# Patient Record
Sex: Female | Born: 1980 | Race: Black or African American | Hispanic: No | Smoking: Never smoker
Health system: Southern US, Community
[De-identification: ages and names within clinical notes are randomized; demographics above are authoritative.]

## PROBLEM LIST (undated history)

## (undated) DIAGNOSIS — E119 Type 2 diabetes mellitus without complications: Secondary | ICD-10-CM

## (undated) DIAGNOSIS — I10 Essential (primary) hypertension: Secondary | ICD-10-CM

## (undated) DIAGNOSIS — D75839 Thrombocytosis, unspecified: Secondary | ICD-10-CM

## (undated) HISTORY — DX: Type 2 diabetes mellitus without complications: E11.9

## (undated) HISTORY — DX: Thrombocytosis, unspecified: D75.839

## (undated) HISTORY — DX: Essential (primary) hypertension: I10

## (undated) HISTORY — PX: CHOLECYSTECTOMY: SHX55

---

## 2020-05-11 LAB — OB RESULTS CONSOLE RUBELLA ANTIBODY, IGM: Rubella: IMMUNE

## 2020-05-11 LAB — OB RESULTS CONSOLE HEPATITIS B SURFACE ANTIGEN: Hepatitis B Surface Ag: NEGATIVE

## 2020-05-11 LAB — OB RESULTS CONSOLE HIV ANTIBODY (ROUTINE TESTING): HIV: NONREACTIVE

## 2020-05-12 LAB — OB RESULTS CONSOLE GC/CHLAMYDIA
Chlamydia: NEGATIVE
Gonorrhea: NEGATIVE

## 2020-05-18 DIAGNOSIS — B977 Papillomavirus as the cause of diseases classified elsewhere: Secondary | ICD-10-CM | POA: Diagnosis not present

## 2020-06-29 ENCOUNTER — Other Ambulatory Visit: Payer: Self-pay | Admitting: Obstetrics and Gynecology

## 2020-06-29 DIAGNOSIS — O30009 Twin pregnancy, unspecified number of placenta and unspecified number of amniotic sacs, unspecified trimester: Secondary | ICD-10-CM

## 2020-07-10 ENCOUNTER — Other Ambulatory Visit: Payer: Self-pay

## 2020-07-10 ENCOUNTER — Other Ambulatory Visit: Payer: Self-pay | Admitting: *Deleted

## 2020-07-10 ENCOUNTER — Ambulatory Visit: Payer: Self-pay | Attending: Obstetrics and Gynecology

## 2020-07-10 ENCOUNTER — Ambulatory Visit: Payer: Self-pay | Admitting: *Deleted

## 2020-07-10 VITALS — BP 120/82 | HR 83 | Ht 64.0 in

## 2020-07-10 DIAGNOSIS — O10919 Unspecified pre-existing hypertension complicating pregnancy, unspecified trimester: Secondary | ICD-10-CM | POA: Insufficient documentation

## 2020-07-10 DIAGNOSIS — O09529 Supervision of elderly multigravida, unspecified trimester: Secondary | ICD-10-CM

## 2020-07-10 DIAGNOSIS — Z3689 Encounter for other specified antenatal screening: Secondary | ICD-10-CM | POA: Insufficient documentation

## 2020-07-10 DIAGNOSIS — O30009 Twin pregnancy, unspecified number of placenta and unspecified number of amniotic sacs, unspecified trimester: Secondary | ICD-10-CM | POA: Insufficient documentation

## 2020-07-13 ENCOUNTER — Other Ambulatory Visit: Payer: Self-pay

## 2020-08-07 ENCOUNTER — Ambulatory Visit: Payer: Self-pay

## 2020-08-17 ENCOUNTER — Encounter: Payer: Self-pay | Admitting: *Deleted

## 2020-08-17 ENCOUNTER — Other Ambulatory Visit: Payer: Self-pay | Admitting: *Deleted

## 2020-08-17 ENCOUNTER — Other Ambulatory Visit: Payer: Self-pay

## 2020-08-17 ENCOUNTER — Ambulatory Visit: Payer: BC Managed Care – PPO | Attending: Obstetrics and Gynecology | Admitting: *Deleted

## 2020-08-17 ENCOUNTER — Ambulatory Visit (HOSPITAL_BASED_OUTPATIENT_CLINIC_OR_DEPARTMENT_OTHER): Payer: BC Managed Care – PPO

## 2020-08-17 VITALS — BP 120/43 | HR 59

## 2020-08-17 DIAGNOSIS — O99213 Obesity complicating pregnancy, third trimester: Secondary | ICD-10-CM | POA: Insufficient documentation

## 2020-08-17 DIAGNOSIS — Z363 Encounter for antenatal screening for malformations: Secondary | ICD-10-CM | POA: Diagnosis not present

## 2020-08-17 DIAGNOSIS — E119 Type 2 diabetes mellitus without complications: Secondary | ICD-10-CM

## 2020-08-17 DIAGNOSIS — O09529 Supervision of elderly multigravida, unspecified trimester: Secondary | ICD-10-CM

## 2020-08-17 DIAGNOSIS — Z3A28 28 weeks gestation of pregnancy: Secondary | ICD-10-CM

## 2020-08-17 DIAGNOSIS — O09523 Supervision of elderly multigravida, third trimester: Secondary | ICD-10-CM

## 2020-08-17 DIAGNOSIS — O10919 Unspecified pre-existing hypertension complicating pregnancy, unspecified trimester: Secondary | ICD-10-CM

## 2020-08-17 DIAGNOSIS — O24113 Pre-existing diabetes mellitus, type 2, in pregnancy, third trimester: Secondary | ICD-10-CM | POA: Diagnosis not present

## 2020-08-17 DIAGNOSIS — D259 Leiomyoma of uterus, unspecified: Secondary | ICD-10-CM

## 2020-08-17 DIAGNOSIS — O3413 Maternal care for benign tumor of corpus uteri, third trimester: Secondary | ICD-10-CM

## 2020-08-17 DIAGNOSIS — O10013 Pre-existing essential hypertension complicating pregnancy, third trimester: Secondary | ICD-10-CM

## 2020-08-17 DIAGNOSIS — E669 Obesity, unspecified: Secondary | ICD-10-CM

## 2020-09-07 ENCOUNTER — Ambulatory Visit: Payer: BC Managed Care – PPO

## 2020-09-14 ENCOUNTER — Ambulatory Visit: Payer: BC Managed Care – PPO | Attending: Obstetrics and Gynecology

## 2020-09-14 ENCOUNTER — Encounter: Payer: Self-pay | Admitting: *Deleted

## 2020-09-14 ENCOUNTER — Other Ambulatory Visit: Payer: Self-pay | Admitting: *Deleted

## 2020-09-14 ENCOUNTER — Other Ambulatory Visit: Payer: Self-pay

## 2020-09-14 ENCOUNTER — Ambulatory Visit: Payer: BC Managed Care – PPO | Admitting: *Deleted

## 2020-09-14 VITALS — BP 118/66 | HR 68

## 2020-09-14 DIAGNOSIS — O10013 Pre-existing essential hypertension complicating pregnancy, third trimester: Secondary | ICD-10-CM

## 2020-09-14 DIAGNOSIS — O99213 Obesity complicating pregnancy, third trimester: Secondary | ICD-10-CM | POA: Diagnosis not present

## 2020-09-14 DIAGNOSIS — E119 Type 2 diabetes mellitus without complications: Secondary | ICD-10-CM

## 2020-09-14 DIAGNOSIS — O3413 Maternal care for benign tumor of corpus uteri, third trimester: Secondary | ICD-10-CM

## 2020-09-14 DIAGNOSIS — Z3A32 32 weeks gestation of pregnancy: Secondary | ICD-10-CM

## 2020-09-14 DIAGNOSIS — O24113 Pre-existing diabetes mellitus, type 2, in pregnancy, third trimester: Secondary | ICD-10-CM | POA: Diagnosis not present

## 2020-09-14 DIAGNOSIS — O10913 Unspecified pre-existing hypertension complicating pregnancy, third trimester: Secondary | ICD-10-CM | POA: Diagnosis present

## 2020-09-14 DIAGNOSIS — O10919 Unspecified pre-existing hypertension complicating pregnancy, unspecified trimester: Secondary | ICD-10-CM | POA: Diagnosis present

## 2020-09-14 DIAGNOSIS — D259 Leiomyoma of uterus, unspecified: Secondary | ICD-10-CM

## 2020-09-21 ENCOUNTER — Encounter: Payer: Self-pay | Admitting: *Deleted

## 2020-09-21 ENCOUNTER — Other Ambulatory Visit: Payer: Self-pay

## 2020-09-21 ENCOUNTER — Ambulatory Visit: Payer: BC Managed Care – PPO | Admitting: *Deleted

## 2020-09-21 ENCOUNTER — Ambulatory Visit: Payer: BC Managed Care – PPO | Attending: Obstetrics and Gynecology

## 2020-09-21 VITALS — BP 119/48 | HR 70

## 2020-09-21 DIAGNOSIS — O24113 Pre-existing diabetes mellitus, type 2, in pregnancy, third trimester: Secondary | ICD-10-CM

## 2020-09-21 DIAGNOSIS — O99213 Obesity complicating pregnancy, third trimester: Secondary | ICD-10-CM

## 2020-09-21 DIAGNOSIS — O10013 Pre-existing essential hypertension complicating pregnancy, third trimester: Secondary | ICD-10-CM | POA: Diagnosis not present

## 2020-09-21 DIAGNOSIS — O3413 Maternal care for benign tumor of corpus uteri, third trimester: Secondary | ICD-10-CM

## 2020-09-21 DIAGNOSIS — E119 Type 2 diabetes mellitus without complications: Secondary | ICD-10-CM | POA: Diagnosis not present

## 2020-09-21 DIAGNOSIS — Z3A33 33 weeks gestation of pregnancy: Secondary | ICD-10-CM

## 2020-09-21 DIAGNOSIS — O10919 Unspecified pre-existing hypertension complicating pregnancy, unspecified trimester: Secondary | ICD-10-CM | POA: Insufficient documentation

## 2020-09-21 DIAGNOSIS — D259 Leiomyoma of uterus, unspecified: Secondary | ICD-10-CM

## 2020-09-21 DIAGNOSIS — Z7984 Long term (current) use of oral hypoglycemic drugs: Secondary | ICD-10-CM

## 2020-09-25 ENCOUNTER — Ambulatory Visit: Payer: BC Managed Care – PPO

## 2020-09-28 ENCOUNTER — Other Ambulatory Visit: Payer: Self-pay

## 2020-09-28 ENCOUNTER — Ambulatory Visit: Payer: BC Managed Care – PPO | Admitting: *Deleted

## 2020-09-28 ENCOUNTER — Encounter: Payer: Self-pay | Admitting: *Deleted

## 2020-09-28 ENCOUNTER — Ambulatory Visit: Payer: BC Managed Care – PPO | Attending: Obstetrics and Gynecology

## 2020-09-28 VITALS — BP 111/58 | HR 83

## 2020-09-28 DIAGNOSIS — O24113 Pre-existing diabetes mellitus, type 2, in pregnancy, third trimester: Secondary | ICD-10-CM | POA: Diagnosis not present

## 2020-09-28 DIAGNOSIS — O10913 Unspecified pre-existing hypertension complicating pregnancy, third trimester: Secondary | ICD-10-CM | POA: Diagnosis present

## 2020-09-28 DIAGNOSIS — Z3A34 34 weeks gestation of pregnancy: Secondary | ICD-10-CM

## 2020-09-28 DIAGNOSIS — O10919 Unspecified pre-existing hypertension complicating pregnancy, unspecified trimester: Secondary | ICD-10-CM | POA: Diagnosis not present

## 2020-09-28 DIAGNOSIS — Z7984 Long term (current) use of oral hypoglycemic drugs: Secondary | ICD-10-CM

## 2020-09-28 DIAGNOSIS — E119 Type 2 diabetes mellitus without complications: Secondary | ICD-10-CM | POA: Diagnosis not present

## 2020-09-28 DIAGNOSIS — D259 Leiomyoma of uterus, unspecified: Secondary | ICD-10-CM

## 2020-09-28 DIAGNOSIS — O10013 Pre-existing essential hypertension complicating pregnancy, third trimester: Secondary | ICD-10-CM

## 2020-09-28 DIAGNOSIS — O99213 Obesity complicating pregnancy, third trimester: Secondary | ICD-10-CM | POA: Diagnosis not present

## 2020-09-28 DIAGNOSIS — O3413 Maternal care for benign tumor of corpus uteri, third trimester: Secondary | ICD-10-CM

## 2020-10-02 ENCOUNTER — Ambulatory Visit: Payer: BC Managed Care – PPO

## 2020-10-05 ENCOUNTER — Other Ambulatory Visit: Payer: Self-pay

## 2020-10-05 ENCOUNTER — Encounter: Payer: Self-pay | Admitting: *Deleted

## 2020-10-05 ENCOUNTER — Ambulatory Visit: Payer: BC Managed Care – PPO | Admitting: *Deleted

## 2020-10-05 ENCOUNTER — Ambulatory Visit: Payer: BC Managed Care – PPO | Attending: Obstetrics and Gynecology

## 2020-10-05 VITALS — BP 123/61 | HR 78

## 2020-10-05 DIAGNOSIS — O24113 Pre-existing diabetes mellitus, type 2, in pregnancy, third trimester: Secondary | ICD-10-CM

## 2020-10-05 DIAGNOSIS — O10013 Pre-existing essential hypertension complicating pregnancy, third trimester: Secondary | ICD-10-CM

## 2020-10-05 DIAGNOSIS — O3413 Maternal care for benign tumor of corpus uteri, third trimester: Secondary | ICD-10-CM

## 2020-10-05 DIAGNOSIS — Z3A35 35 weeks gestation of pregnancy: Secondary | ICD-10-CM

## 2020-10-05 DIAGNOSIS — E119 Type 2 diabetes mellitus without complications: Secondary | ICD-10-CM

## 2020-10-05 DIAGNOSIS — O99213 Obesity complicating pregnancy, third trimester: Secondary | ICD-10-CM | POA: Diagnosis not present

## 2020-10-05 DIAGNOSIS — D259 Leiomyoma of uterus, unspecified: Secondary | ICD-10-CM

## 2020-10-10 ENCOUNTER — Encounter (HOSPITAL_COMMUNITY): Payer: Self-pay | Admitting: Obstetrics and Gynecology

## 2020-10-10 ENCOUNTER — Other Ambulatory Visit: Payer: Self-pay

## 2020-10-10 ENCOUNTER — Inpatient Hospital Stay (HOSPITAL_COMMUNITY)
Admission: AD | Admit: 2020-10-10 | Discharge: 2020-10-10 | Disposition: A | Discharge status: Home / Self Care | Payer: BC Managed Care – PPO | Attending: Obstetrics and Gynecology | Admitting: Obstetrics and Gynecology

## 2020-10-10 DIAGNOSIS — U071 COVID-19: Secondary | ICD-10-CM | POA: Diagnosis not present

## 2020-10-10 DIAGNOSIS — O133 Gestational [pregnancy-induced] hypertension without significant proteinuria, third trimester: Secondary | ICD-10-CM | POA: Diagnosis not present

## 2020-10-10 DIAGNOSIS — I10 Essential (primary) hypertension: Secondary | ICD-10-CM | POA: Diagnosis present

## 2020-10-10 DIAGNOSIS — Z3A36 36 weeks gestation of pregnancy: Secondary | ICD-10-CM

## 2020-10-10 DIAGNOSIS — O24419 Gestational diabetes mellitus in pregnancy, unspecified control: Secondary | ICD-10-CM | POA: Diagnosis not present

## 2020-10-10 DIAGNOSIS — E119 Type 2 diabetes mellitus without complications: Secondary | ICD-10-CM

## 2020-10-10 DIAGNOSIS — O10913 Unspecified pre-existing hypertension complicating pregnancy, third trimester: Secondary | ICD-10-CM

## 2020-10-10 DIAGNOSIS — O98513 Other viral diseases complicating pregnancy, third trimester: Secondary | ICD-10-CM | POA: Diagnosis present

## 2020-10-10 DIAGNOSIS — O24113 Pre-existing diabetes mellitus, type 2, in pregnancy, third trimester: Secondary | ICD-10-CM

## 2020-10-10 LAB — COMPREHENSIVE METABOLIC PANEL
ALT: 12 U/L (ref 0–44)
AST: 15 U/L (ref 15–41)
Albumin: 2.6 g/dL — ABNORMAL LOW (ref 3.5–5.0)
Alkaline Phosphatase: 96 U/L (ref 38–126)
Anion gap: 9 (ref 5–15)
BUN: 6 mg/dL (ref 6–20)
CO2: 20 mmol/L — ABNORMAL LOW (ref 22–32)
Calcium: 9.3 mg/dL (ref 8.9–10.3)
Chloride: 102 mmol/L (ref 98–111)
Creatinine, Ser: 0.61 mg/dL (ref 0.44–1.00)
GFR, Estimated: >60 mL/min (ref >60)
Glucose, Bld: 136 mg/dL — ABNORMAL HIGH (ref 70–99)
Potassium: 4 mmol/L (ref 3.5–5.1)
Sodium: 131 mmol/L — ABNORMAL LOW (ref 135–145)
Total Bilirubin: 0.3 mg/dL (ref 0.3–1.2)
Total Protein: 7.4 g/dL (ref 6.5–8.1)

## 2020-10-10 LAB — CBC
HCT: 34.4 % — ABNORMAL LOW (ref 36.0–46.0)
Hemoglobin: 11.8 g/dL — ABNORMAL LOW (ref 12.0–15.0)
MCH: 32.1 pg (ref 26.0–34.0)
MCHC: 34.3 g/dL (ref 30.0–36.0)
MCV: 93.5 fL (ref 80.0–100.0)
Platelets: 416 K/uL — ABNORMAL HIGH (ref 150–400)
RBC: 3.68 MIL/uL — ABNORMAL LOW (ref 3.87–5.11)
RDW: 15 % (ref 11.5–15.5)
WBC: 6.3 K/uL (ref 4.0–10.5)
nRBC: 0 % (ref 0.0–0.2)

## 2020-10-10 LAB — URINALYSIS, ROUTINE W REFLEX MICROSCOPIC
Bilirubin Urine: NEGATIVE
Glucose, UA: >=500 mg/dL — AB
Hgb urine dipstick: NEGATIVE
Ketones, ur: NEGATIVE mg/dL
Leukocytes,Ua: NEGATIVE
Nitrite: NEGATIVE
Protein, ur: NEGATIVE mg/dL
Specific Gravity, Urine: 1.017 (ref 1.005–1.030)
pH: 6 (ref 5.0–8.0)

## 2020-10-10 LAB — PROTEIN / CREATININE RATIO, URINE
Creatinine, Urine: 181.08 mg/dL
Protein Creatinine Ratio: 0.19 mg/mg{Cre} — ABNORMAL HIGH (ref 0.00–0.15)
Total Protein, Urine: 34 mg/dL

## 2020-10-10 LAB — RESP PANEL BY RT-PCR (FLU A&B, COVID) ARPGX2
Influenza A by PCR: NEGATIVE
Influenza B by PCR: NEGATIVE
SARS Coronavirus 2 by RT PCR: POSITIVE — AB

## 2020-10-10 MED ORDER — SODIUM CHLORIDE 0.9 % IV SOLN
1200.0000 mg | Freq: Once | INTRAVENOUS | Status: DC
  Filled 2020-10-10: qty 10

## 2020-10-10 MED ORDER — SODIUM CHLORIDE 0.9 % IV SOLN: 1200.0000 mg | Freq: Once | INTRAVENOUS | Status: DC

## 2020-10-10 MED ORDER — FAMOTIDINE IN NACL 20-0.9 MG/50ML-% IV SOLN: 20.0000 mg | Freq: Once | INTRAVENOUS | Status: DC | PRN

## 2020-10-10 MED ORDER — ACETAMINOPHEN 325 MG PO TABS
650.0000 mg | ORAL_TABLET | Freq: Once | ORAL | Status: AC
  Administered 2020-10-10: 17:00:00 650 mg via ORAL
  Filled 2020-10-10: qty 2

## 2020-10-10 MED ORDER — SODIUM CHLORIDE 0.9 % IV SOLN
Freq: Once | INTRAVENOUS | Status: AC
  Filled 2020-10-10: qty 20

## 2020-10-10 MED ORDER — METHYLPREDNISOLONE SODIUM SUCC 125 MG IJ SOLR: 125.0000 mg | Freq: Once | INTRAMUSCULAR | Status: DC | PRN

## 2020-10-10 MED ORDER — EPINEPHRINE 0.3 MG/0.3ML IJ SOAJ
0.3000 mg | Freq: Once | INTRAMUSCULAR | Status: DC | PRN
  Filled 2020-10-10: qty 0.6

## 2020-10-10 MED ORDER — DIPHENHYDRAMINE HCL 50 MG/ML IJ SOLN: 50.0000 mg | Freq: Once | INTRAMUSCULAR | Status: DC | PRN

## 2020-10-10 MED ORDER — SODIUM CHLORIDE 0.9 % IV SOLN
INTRAVENOUS | Status: DC | PRN
  Administered 2020-10-10: 19:00:00 1000 mL via INTRAVENOUS

## 2020-10-10 MED ORDER — ALBUTEROL SULFATE HFA 108 (90 BASE) MCG/ACT IN AERS
2.0000 | INHALATION_SPRAY | Freq: Once | RESPIRATORY_TRACT | Status: DC | PRN
  Filled 2020-10-10: qty 6.7

## 2020-10-10 NOTE — ED Triage Notes (Signed)
Emergency Medicine Provider Triage Evaluation Note  Eileen Stevens , a 39 y.o. female  was evaluated in triage.  Pt complains of body aches and low-grade fever with T-max of 99.8 F last night, despite multiple doses of Tylenol.  Patient is vaccinated against COVID-19.  She is [redacted] weeks pregnant with a female fetus, denies vaginal bleeding, loss of fluids.  Endorses normal fetal movement.  Review of Systems  Positive: Body aches and fever Negative: Shortness of breath, palpitation, chest pain, nausea or vomiting, headache  Physical Exam  BP (!) 142/97   Pulse (!) 122   Temp 99.7 F (37.6 C) (Oral)   Resp 18   LMP 02/01/2020   SpO2 100%  Gen:   Awake, no distress   HEENT:  Atraumatic  Resp:  Normal effort  Cardiac:  Normal rate  Abd:   36-week uterus MSK:   Moves extremities without difficulty  Neuro:  Speech clear   Medical Decision Making  Medically screening exam initiated at 3:15 PM.  Appropriate orders placed.  Laverta Harnisch was informed that the remainder of the evaluation will be completed by another provider, this initial triage assessment does not replace that evaluation, and the importance of remaining in the ED until their evaluation is complete.  Clinical Impression  Patient medically stable for transfer to MAU.  I personally reviewed from ED provider who is ready to receive this patient in the department   Emeline Darling, PA-C 10/10/20 1516

## 2020-10-10 NOTE — ED Triage Notes (Signed)
Pt reports generalized body aches and fever since yesterday, [redacted] weeks pregnant. Fully vaccinated for COVID

## 2020-10-10 NOTE — MAU Provider Note (Signed)
None     Chief Complaint:  Night Sweats and Generalized Body Aches   Eileen Stevens is  39 y.o. G3P1011 at [redacted]w[redacted]d presents complaining of Night Sweats and Generalized Body Aches  Per RN:  Pt reports late yesterday afternoon she started sweating and took her temperature at home and it was 99.5.   Pt reports increasing fluid intake and took tylenol.   Pt reports sweating throughout the night. Pt reports the temperature never got higher than 99.6, but pt was worried about the sweating and the body aches.   Pt reports breaking out into a big sweat this morning.   Pt reports feeling better for a period of time today, but then the symptoms returned.   Denies vaginal bleeding or LOF.   Reports +FM           Has CHTN (on procardia) and Class B DM (on Metformin).  Last APAP at 0500   Obstetrical/Gynecological History: OB History    Gravida  3   Para  1   Term  1   Preterm      AB  1   Living  1     SAB  1   IAB      Ectopic      Multiple      Live Births  1          Past Medical History: Past Medical History:  Diagnosis Date  . Diabetes mellitus without complication (HCC)    Type 2  . Hypertension   . Thrombocytosis     Past Surgical History: Past Surgical History:  Procedure Laterality Date  . CHOLECYSTECTOMY      Family History: Family History  Problem Relation Age of Onset  . Hypertension Mother   . Diabetes Mother   . Diabetes Maternal Grandmother     Social History: Social History   Tobacco Use  . Smoking status: Never Smoker  . Smokeless tobacco: Never Used  Vaping Use  . Vaping Use: Never used  Substance Use Topics  . Alcohol use: Not Currently  . Drug use: Never    Allergies:  Allergies  Allergen Reactions  . Losartan Other (See Comments)    Cough    Meds:  Medications Prior to Admission  Medication Sig Dispense Refill Last Dose  . acetaminophen (TYLENOL) 500 MG tablet Take 500 mg by mouth every 6 (six)  hours as needed.   10/10/2020 at Unknown time  . aspirin 81 MG chewable tablet Chew by mouth daily.   10/09/2020 at Unknown time  . ferrous sulfate 325 (65 FE) MG tablet Take 325 mg by mouth daily with breakfast.   10/09/2020 at Unknown time  . metFORMIN (GLUCOPHAGE) 500 MG tablet Take by mouth daily.   10/09/2020 at Unknown time  . NIFEDIPINE ER PO Take 30 mg by mouth daily.   Past Week at Unknown time  . Prenatal Vit-Fe Fumarate-FA (PRENATAL VITAMINS PO) Take by mouth.   10/09/2020 at Unknown time    Review of Systems   Constitutional: POSITIVE for fever and chills Eyes: Negative for visual disturbances Respiratory: Negative for shortness of breath, dyspnea Cardiovascular: Negative for chest pain or palpitations  Gastrointestinal: Negative for vomiting, diarrhea and constipation Genitourinary: Negative for dysuria and urgency Musculoskeletal: POSITIVE for myalgias.  Normal ROM  Neurological: Negative for dizziness and headaches    Physical Exam  Blood pressure 118/81, pulse (!) 102, temperature 100.3 F (37.9 C), temperature source Oral, resp. rate 20, last menstrual period 02/01/2020,  SpO2 99 %. GENERAL: Well-developed, well-nourished female in no acute distress.  LUNGS: Normal respiratory effort HEART: Regular rate and rhythm. ABDOMEN: Soft, nontender, nondistended, gravid.  EXTREMITIES: Nontender, no edema, 2+ distal pulses. DTR's 2+ FHT:  Baseline rate 155-160 bpm   Variability moderate  Accelerations present   Decelerations none Contractions: Every 0 mins   Labs: Results for orders placed or performed during the hospital encounter of 10/10/20 (from the past 24 hour(s))  Resp Panel by RT-PCR (Flu A&B, Covid) Nasopharyngeal Swab   Collection Time: 10/10/20  3:36 PM   Specimen: Nasopharyngeal Swab; Nasopharyngeal(NP) swabs in vial transport medium  Result Value Ref Range   SARS Coronavirus 2 by RT PCR POSITIVE (A) NEGATIVE   Influenza A by PCR NEGATIVE NEGATIVE    Influenza B by PCR NEGATIVE NEGATIVE  CBC   Collection Time: 10/10/20  4:05 PM  Result Value Ref Range   WBC 6.3 4.0 - 10.5 K/uL   RBC 3.68 (L) 3.87 - 5.11 MIL/uL   Hemoglobin 11.8 (L) 12.0 - 15.0 g/dL   HCT 34.4 (L) 36.0 - 46.0 %   MCV 93.5 80.0 - 100.0 fL   MCH 32.1 26.0 - 34.0 pg   MCHC 34.3 30.0 - 36.0 g/dL   RDW 15.0 11.5 - 15.5 %   Platelets 416 (H) 150 - 400 K/uL   nRBC 0.0 0.0 - 0.2 %  Urinalysis, Routine w reflex microscopic Urine, Clean Catch   Collection Time: 10/10/20  4:09 PM  Result Value Ref Range   Color, Urine YELLOW YELLOW   APPearance CLEAR CLEAR   Specific Gravity, Urine 1.017 1.005 - 1.030   pH 6.0 5.0 - 8.0   Glucose, UA >=500 (A) NEGATIVE mg/dL   Hgb urine dipstick NEGATIVE NEGATIVE   Bilirubin Urine NEGATIVE NEGATIVE   Ketones, ur NEGATIVE NEGATIVE mg/dL   Protein, ur NEGATIVE NEGATIVE mg/dL   Nitrite NEGATIVE NEGATIVE   Leukocytes,Ua NEGATIVE NEGATIVE   RBC / HPF 0-5 0 - 5 RBC/hpf   WBC, UA 0-5 0 - 5 WBC/hpf   Bacteria, UA RARE (A) NONE SEEN   Squamous Epithelial / LPF 0-5 0 - 5   Mucus PRESENT   Protein / creatinine ratio, urine   Collection Time: 10/10/20  4:09 PM  Result Value Ref Range   Creatinine, Urine 181.08 mg/dL   Total Protein, Urine 34 mg/dL   Protein Creatinine Ratio 0.19 (H) 0.00 - 0.15 mg/mg[Cre]   Imaging Studies:    Assessment: Eileen Stevens is  39 y.o. G3P1011 at 102w0d presents with Covid-19.  Plan: MABs given in MAU, isolation precautions discussed Copy of MAU note sent to [place] will be assumed by Darrol Poke, CNM  Christin Fudge 12/25/20215:13 PM

## 2020-10-10 NOTE — MAU Note (Signed)
Pt reports late yesterday afternoon she started sweating and took her temperature at home and it was 99.5.   Pt reports increasing fluid intake and took tylenol.   Pt reports sweating throughout the night. Pt reports the temperature never got higher than 99.6, but pt was worried about the sweating and the body aches.   Pt reports breaking out into a big sweat this morning.   Pt reports feeling better for a period of time today, but then the symptoms returned.   Denies vaginal bleeding or LOF.   Reports +FM

## 2020-10-10 NOTE — MAU Provider Note (Signed)
Eileen Stevens, CNM assumed care of [name] infusing.   S: Pt feeling well after infusion. Denies SOB, difficulty breathing. Fever resolved w/ Tylenol.   O: NAD. Nml work of breathing. Well-appearing BP 124/76 (BP Location: Right Arm)   Pulse (!) 107   Temp 98.8 F (37.1 C) (Oral)   Resp 19   LMP 02/01/2020   SpO2 99%   A:  1. COVID-19   2. [redacted] weeks gestation of pregnancy   3. Type 2 diabetes mellitus affecting pregnancy in third trimester, antepartum   4. Chronic hypertension complicating or reason for care during pregnancy, third trimester    P: D/C home in stable condition Covid precautions. Isolate x 10 days. Inform contacts.  Tylenol PRN for fever. Push fluids.  Labor precautions and [facility] Follow up.   Specialty: Obstetrics and Gynecology Why: contact on Monday and let them know you have Covid; follow their instructions regarding antenatal testing/office visits Contact information: [address] [place] [address] [phone]       [facility] Follow up.   Specialty: Obstetrics and Gynecology Why: as needed in emergencies (shortness of breath, uncontrolled fever) or labor Contact information: [id number] [place] [address] [phone]             Tamala Julian [place], [place] 10/10/2020 10:07 PM

## 2020-10-10 NOTE — Discharge Instructions (Signed)
COVID-19: How to Protect Yourself and Others °Know how it spreads °· There is currently no vaccine to prevent coronavirus disease 2019 (COVID-19). °· The best way to prevent illness is to avoid being exposed to this virus. °· The virus is thought to spread mainly from person-to-person. °? Between people who are in close contact with one another (within about 6 feet). °? Through respiratory droplets produced when an infected person coughs, sneezes or talks. °? These droplets can land in the mouths or noses of people who are nearby or possibly be inhaled into the lungs. °? COVID-19 may be spread by people who are not showing symptoms. °Everyone should °Clean your hands often °· Wash your hands often with soap and water for at least 20 seconds especially after you have been in a public place, or after blowing your nose, coughing, or sneezing. °· If soap and water are not readily available, use a hand sanitizer that contains at least 60% alcohol. Cover all surfaces of your hands and rub them together until they feel dry. °· Avoid touching your eyes, nose, and mouth with unwashed hands. °Avoid close contact °· Limit contact with others as much as possible. °· Avoid close contact with people who are sick. °· Put distance between yourself and other people. °? Remember that some people without symptoms may be able to spread virus. °? This is especially important for people who are at higher risk of getting very sick.www.cdc.gov/coronavirus/2019-ncov/need-extra-precautions/people-at-higher-risk.html °Cover your mouth and nose with a mask when around others °· You could spread COVID-19 to others even if you do not feel sick. °· Everyone should wear a mask in public settings and when around people not living in their household, especially when social distancing is difficult to maintain. °? Masks should not be placed on young children under age 2, anyone who has trouble breathing, or is unconscious, incapacitated or otherwise  unable to remove the mask without assistance. °· The mask is meant to protect other people in case you are infected. °· Do NOT use a facemask meant for a healthcare worker. °· Continue to keep about 6 feet between yourself and others. The mask is not a substitute for social distancing. °Cover coughs and sneezes °· Always cover your mouth and nose with a tissue when you cough or sneeze or use the inside of your elbow. °· Throw used tissues in the trash. °· Immediately wash your hands with soap and water for at least 20 seconds. If soap and water are not readily available, clean your hands with a hand sanitizer that contains at least 60% alcohol. °Clean and disinfect °· Clean AND disinfect frequently touched surfaces daily. This includes tables, doorknobs, light switches, countertops, handles, desks, phones, keyboards, toilets, faucets, and sinks. www.cdc.gov/coronavirus/2019-ncov/prevent-getting-sick/disinfecting-your-home.html °· If surfaces are dirty, clean them: Use detergent or soap and water prior to disinfection. °· Then, use a household disinfectant. You can see a list of EPA-registered household disinfectants here. °cdc.gov/coronavirus °06/19/2019 °This information is not intended to replace advice given to you by your health care provider. Make sure you discuss any questions you have with your health care provider. °Document Revised: 06/27/2019 Document Reviewed: 04/25/2019 °Elsevier Patient Education © 2020 Elsevier Inc. ° ° ° °10 Things You Can Do to Manage Your COVID-19 Symptoms at Home °If you have possible or confirmed COVID-19: °1. Stay home from work and school. And stay away from other public places. If you must go out, avoid using any kind of public transportation, ridesharing, or taxis. °2. Monitor   your symptoms carefully. If your symptoms get worse, call your healthcare provider immediately. °3. Get rest and stay hydrated. °4. If you have a medical appointment, call the healthcare provider ahead  of time and tell them that you have or may have COVID-19. °5. For medical emergencies, call 911 and notify the dispatch personnel that you have or may have COVID-19. °6. Cover your cough and sneezes with a tissue or use the inside of your elbow. °7. Wash your hands often with soap and water for at least 20 seconds or clean your hands with an alcohol-based hand sanitizer that contains at least 60% alcohol. °8. As much as possible, stay in a specific room and away from other people in your home. Also, you should use a separate bathroom, if available. If you need to be around other people in or outside of the home, wear a mask. °9. Avoid sharing personal items with other people in your household, like dishes, towels, and bedding. °10. Clean all surfaces that are touched often, like counters, tabletops, and doorknobs. Use household cleaning sprays or wipes according to the label instructions. °cdc.gov/coronavirus °04/17/2019 °This information is not intended to replace advice given to you by your health care provider. Make sure you discuss any questions you have with your health care provider. °Document Revised: 09/19/2019 Document Reviewed: 09/19/2019 °Elsevier Patient Education © 2020 Elsevier Inc. ° ° ° °COVID-19: Quarantine vs. Isolation °QUARANTINE keeps someone who was in close contact with someone who has COVID-19 away from others. °If you had close contact with a person who has COVID-19 °· Stay home until 14 days after your last contact. °· Check your temperature twice a day and watch for symptoms of COVID-19. °· If possible, stay away from people who are at higher-risk for getting very sick from COVID-19. °ISOLATION keeps someone who is sick or tested positive for COVID-19 without symptoms away from others, even in their own home. °If you are sick and think or know you have COVID-19 °· Stay home until after °? At least 10 days since symptoms first appeared and °? At least 24 hours with no fever without  fever-reducing medication and °? Symptoms have improved °If you tested positive for COVID-19 but do not have symptoms °· Stay home until after °? 10 days have passed since your positive test °If you live with others, stay in a specific "sick room" or area and away from other people or animals, including pets. Use a separate bathroom, if available. °cdc.gov/coronavirus °05/06/2019 °This information is not intended to replace advice given to you by your health care provider. Make sure you discuss any questions you have with your health care provider. °Document Revised: 09/19/2019 Document Reviewed: 09/19/2019 °Elsevier Patient Education © 2020 Elsevier Inc. ° °

## 2020-10-12 ENCOUNTER — Ambulatory Visit: Payer: BC Managed Care – PPO

## 2020-10-12 LAB — OB RESULTS CONSOLE GBS: GBS: NEGATIVE

## 2020-10-22 ENCOUNTER — Ambulatory Visit: Payer: BC Managed Care – PPO | Admitting: *Deleted

## 2020-10-22 ENCOUNTER — Other Ambulatory Visit: Payer: Self-pay

## 2020-10-22 ENCOUNTER — Ambulatory Visit (HOSPITAL_BASED_OUTPATIENT_CLINIC_OR_DEPARTMENT_OTHER): Payer: BC Managed Care – PPO

## 2020-10-22 ENCOUNTER — Other Ambulatory Visit: Payer: Self-pay | Admitting: Obstetrics and Gynecology

## 2020-10-22 VITALS — BP 118/58 | HR 68

## 2020-10-22 DIAGNOSIS — O09523 Supervision of elderly multigravida, third trimester: Secondary | ICD-10-CM

## 2020-10-22 DIAGNOSIS — O24113 Pre-existing diabetes mellitus, type 2, in pregnancy, third trimester: Secondary | ICD-10-CM | POA: Diagnosis not present

## 2020-10-22 DIAGNOSIS — O99213 Obesity complicating pregnancy, third trimester: Secondary | ICD-10-CM | POA: Insufficient documentation

## 2020-10-22 DIAGNOSIS — O3410 Maternal care for benign tumor of corpus uteri, unspecified trimester: Secondary | ICD-10-CM

## 2020-10-22 DIAGNOSIS — Z3A37 37 weeks gestation of pregnancy: Secondary | ICD-10-CM | POA: Insufficient documentation

## 2020-10-22 DIAGNOSIS — O10913 Unspecified pre-existing hypertension complicating pregnancy, third trimester: Secondary | ICD-10-CM

## 2020-10-22 DIAGNOSIS — O3413 Maternal care for benign tumor of corpus uteri, third trimester: Secondary | ICD-10-CM | POA: Insufficient documentation

## 2020-10-22 DIAGNOSIS — D259 Leiomyoma of uterus, unspecified: Secondary | ICD-10-CM | POA: Insufficient documentation

## 2020-10-22 NOTE — Progress Notes (Signed)
IOL 10/24/20

## 2020-10-23 ENCOUNTER — Telehealth (HOSPITAL_COMMUNITY): Payer: Self-pay | Admitting: *Deleted

## 2020-10-23 ENCOUNTER — Encounter (HOSPITAL_COMMUNITY): Payer: Self-pay | Admitting: *Deleted

## 2020-10-23 NOTE — Telephone Encounter (Signed)
Preadmission screen  

## 2020-10-24 ENCOUNTER — Inpatient Hospital Stay (HOSPITAL_COMMUNITY): Payer: BC Managed Care – PPO

## 2020-10-24 ENCOUNTER — Encounter (HOSPITAL_COMMUNITY): Payer: Self-pay | Admitting: Obstetrics and Gynecology

## 2020-10-24 ENCOUNTER — Inpatient Hospital Stay (HOSPITAL_COMMUNITY)
Admission: AD | Admit: 2020-10-24 | Discharge: 2020-10-26 | DRG: 805 | Disposition: A | Discharge status: Home / Self Care | Payer: BC Managed Care – PPO | Attending: Obstetrics and Gynecology | Admitting: Obstetrics and Gynecology

## 2020-10-24 ENCOUNTER — Other Ambulatory Visit: Payer: Self-pay

## 2020-10-24 DIAGNOSIS — I10 Essential (primary) hypertension: Secondary | ICD-10-CM | POA: Diagnosis present

## 2020-10-24 DIAGNOSIS — O9852 Other viral diseases complicating childbirth: Secondary | ICD-10-CM | POA: Diagnosis present

## 2020-10-24 DIAGNOSIS — O09523 Supervision of elderly multigravida, third trimester: Secondary | ICD-10-CM | POA: Diagnosis present

## 2020-10-24 DIAGNOSIS — O9902 Anemia complicating childbirth: Secondary | ICD-10-CM | POA: Diagnosis present

## 2020-10-24 DIAGNOSIS — D259 Leiomyoma of uterus, unspecified: Secondary | ICD-10-CM | POA: Diagnosis present

## 2020-10-24 DIAGNOSIS — B977 Papillomavirus as the cause of diseases classified elsewhere: Secondary | ICD-10-CM | POA: Diagnosis not present

## 2020-10-24 DIAGNOSIS — O26893 Other specified pregnancy related conditions, third trimester: Secondary | ICD-10-CM | POA: Diagnosis present

## 2020-10-24 DIAGNOSIS — Z7984 Long term (current) use of oral hypoglycemic drugs: Secondary | ICD-10-CM | POA: Diagnosis not present

## 2020-10-24 DIAGNOSIS — Z3A38 38 weeks gestation of pregnancy: Secondary | ICD-10-CM | POA: Diagnosis not present

## 2020-10-24 DIAGNOSIS — E119 Type 2 diabetes mellitus without complications: Secondary | ICD-10-CM

## 2020-10-24 DIAGNOSIS — O2412 Pre-existing diabetes mellitus, type 2, in childbirth: Secondary | ICD-10-CM | POA: Diagnosis present

## 2020-10-24 DIAGNOSIS — O26899 Other specified pregnancy related conditions, unspecified trimester: Secondary | ICD-10-CM

## 2020-10-24 DIAGNOSIS — O1002 Pre-existing essential hypertension complicating childbirth: Principal | ICD-10-CM | POA: Diagnosis present

## 2020-10-24 DIAGNOSIS — D509 Iron deficiency anemia, unspecified: Secondary | ICD-10-CM | POA: Diagnosis present

## 2020-10-24 DIAGNOSIS — Z6791 Unspecified blood type, Rh negative: Secondary | ICD-10-CM

## 2020-10-24 DIAGNOSIS — U071 COVID-19: Secondary | ICD-10-CM | POA: Diagnosis present

## 2020-10-24 DIAGNOSIS — O0990 Supervision of high risk pregnancy, unspecified, unspecified trimester: Secondary | ICD-10-CM

## 2020-10-24 DIAGNOSIS — Z862 Personal history of diseases of the blood and blood-forming organs and certain disorders involving the immune mechanism: Secondary | ICD-10-CM

## 2020-10-24 LAB — CBC
HCT: 28.2 % — ABNORMAL LOW (ref 36.0–46.0)
HCT: 41.1 % (ref 36.0–46.0)
Hemoglobin: 9.3 g/dL — ABNORMAL LOW (ref 12.0–15.0)
Hemoglobin: 13 g/dL (ref 12.0–15.0)
MCH: 31.8 pg (ref 26.0–34.0)
MCH: 32.6 pg (ref 26.0–34.0)
MCHC: 33 g/dL (ref 30.0–36.0)
MCHC: 31.6 g/dL (ref 30.0–36.0)
MCV: 96.6 fL (ref 80.0–100.0)
MCV: 103 fL — ABNORMAL HIGH (ref 80.0–100.0)
Platelets: 342 10*3/uL (ref 150–400)
Platelets: 375 10*3/uL (ref 150–400)
RBC: 2.92 MIL/uL — ABNORMAL LOW (ref 3.87–5.11)
RBC: 3.99 MIL/uL (ref 3.87–5.11)
RDW: 14.9 % (ref 11.5–15.5)
RDW: 15.1 % (ref 11.5–15.5)
WBC: 5.9 10*3/uL (ref 4.0–10.5)
WBC: 9.1 10*3/uL (ref 4.0–10.5)
nRBC: 0 % (ref 0.0–0.2)
nRBC: 0 % (ref 0.0–0.2)

## 2020-10-24 LAB — GLUCOSE, CAPILLARY
Glucose-Capillary: 124 mg/dL — ABNORMAL HIGH (ref 70–99)
Glucose-Capillary: 105 mg/dL — ABNORMAL HIGH (ref 70–99)
Glucose-Capillary: 101 mg/dL — ABNORMAL HIGH (ref 70–99)

## 2020-10-24 LAB — TYPE AND SCREEN
ABO/RH(D): A NEG
Antibody Screen: POSITIVE

## 2020-10-24 LAB — ABO/RH: ABO/RH(D): A NEG

## 2020-10-24 MED ORDER — EPHEDRINE 5 MG/ML INJ: 10.0000 mg | INTRAVENOUS | Status: DC | PRN

## 2020-10-24 MED ORDER — ONDANSETRON HCL 4 MG/2ML IJ SOLN: 4.0000 mg | Freq: Four times a day (QID) | INTRAMUSCULAR | Status: DC | PRN

## 2020-10-24 MED ORDER — LACTATED RINGERS IV SOLN: INTRAVENOUS | Status: DC

## 2020-10-24 MED ORDER — OXYCODONE-ACETAMINOPHEN 5-325 MG PO TABS: 2.0000 | ORAL_TABLET | ORAL | Status: DC | PRN

## 2020-10-24 MED ORDER — LACTATED RINGERS IV SOLN
500.0000 mL | Freq: Once | INTRAVENOUS | Status: AC
  Administered 2020-10-24: 500 mL via INTRAVENOUS

## 2020-10-24 MED ORDER — PHENYLEPHRINE 40 MCG/ML (10ML) SYRINGE FOR IV PUSH (FOR BLOOD PRESSURE SUPPORT): 80.0000 ug | PREFILLED_SYRINGE | INTRAVENOUS | Status: DC | PRN

## 2020-10-24 MED ORDER — OXYCODONE-ACETAMINOPHEN 5-325 MG PO TABS
1.0000 | ORAL_TABLET | ORAL | Status: DC | PRN
Start: 2020-10-24 — End: 2020-10-25

## 2020-10-24 MED ORDER — SOD CITRATE-CITRIC ACID 500-334 MG/5ML PO SOLN: 30.0000 mL | ORAL | Status: DC | PRN

## 2020-10-24 MED ORDER — OXYTOCIN BOLUS FROM INFUSION
333.0000 mL | Freq: Once | INTRAVENOUS | Status: AC
  Administered 2020-10-24: 333 mL via INTRAVENOUS

## 2020-10-24 MED ORDER — TERBUTALINE SULFATE 1 MG/ML IJ SOLN: 0.2500 mg | Freq: Once | INTRAMUSCULAR | Status: DC | PRN

## 2020-10-24 MED ORDER — ACETAMINOPHEN 325 MG PO TABS: 650.0000 mg | ORAL_TABLET | ORAL | Status: DC | PRN

## 2020-10-24 MED ORDER — FENTANYL CITRATE (PF) 100 MCG/2ML IJ SOLN
INTRAMUSCULAR | Status: AC
  Filled 2020-10-24: qty 2

## 2020-10-24 MED ORDER — FENTANYL CITRATE (PF) 100 MCG/2ML IJ SOLN
50.0000 ug | INTRAMUSCULAR | Status: DC | PRN
  Administered 2020-10-24: 100 ug via INTRAVENOUS

## 2020-10-24 MED ORDER — FLEET ENEMA 7-19 GM/118ML RE ENEM: 1.0000 | ENEMA | RECTAL | Status: DC | PRN

## 2020-10-24 MED ORDER — DIPHENHYDRAMINE HCL 50 MG/ML IJ SOLN: 12.5000 mg | INTRAMUSCULAR | Status: DC | PRN

## 2020-10-24 MED ORDER — OXYTOCIN-SODIUM CHLORIDE 30-0.9 UT/500ML-% IV SOLN
1.0000 m[IU]/min | INTRAVENOUS | Status: DC
  Administered 2020-10-24: 2 m[IU]/min via INTRAVENOUS
  Filled 2020-10-24 (×2): qty 500

## 2020-10-24 MED ORDER — LACTATED RINGERS IV SOLN: 500.0000 mL | INTRAVENOUS | Status: DC | PRN

## 2020-10-24 MED ORDER — FENTANYL-BUPIVACAINE-NACL 0.5-0.125-0.9 MG/250ML-% EP SOLN
12.0000 mL/h | EPIDURAL | Status: DC | PRN
  Filled 2020-10-24: qty 250

## 2020-10-24 MED ORDER — LIDOCAINE HCL (PF) 1 % IJ SOLN
30.0000 mL | INTRAMUSCULAR | Status: DC | PRN
  Administered 2020-10-24: 30 mL via SUBCUTANEOUS
  Filled 2020-10-24: qty 30

## 2020-10-24 MED ORDER — OXYTOCIN-SODIUM CHLORIDE 30-0.9 UT/500ML-% IV SOLN: 2.5000 [IU]/h | INTRAVENOUS | Status: DC

## 2020-10-24 NOTE — H&P (Cosign Needed)
OB ADMISSION/ HISTORY & PHYSICAL:  Admission Date: 10/24/2020  9:42 AM  Admit Diagnosis: IOL for T2DM and CHTN  Eileen Stevens is a 40 y.o. female G3P1011 [redacted]w[redacted]d presenting for IOL. Endorses active FM, denies LOF and vaginal bleeding. Pregnancy complicated by W0JW controlled on Metformin 1000 mg PO BID, CHTN on Procardia 30XL since 17+5 wks. Anterior uterine fibroid 2 x 1.32 x 2.31 noted on [place] @ 29 wks.Covid pos 12/25 and treated w/ monoclonal antibodies, pt had been vaccinated for Covid during her 2nd trimester.   History of current pregnancy: G3P1011   Patient entered care with [facility] at 17+5 wks.   EDC by LMP and congruent w/ 14+2 wk U/S.   Anatomy scan:  22 wks, incomplete w/ posterior placenta, repeat at .   Antenatal testing: for T2DM and CHTN  started at 32 weeks Last evaluation: 37+5  wks vtx, posterior placenta, AFI 11.4, 36%ile, BPP 8/8, EFW 8+7, 95%ile  Significant prenatal events:  Patient Active Problem List   Diagnosis Date Noted  . Type 2 diabetes mellitus ([place])     Priority: Medium  . History of thrombocytosis 10/24/2020  . IDA (iron deficiency anemia) 10/24/2020  . Benign essential hypertension 10/24/2020  . AMA (advanced maternal age) multigravida 75+, third trimester 10/24/2020  . 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    Prenatal Labs: ABO, Rh:  A negative  Antibody:  negative  Rubella:   Immune RPR:   NR HBsAg:   NR HIV:   NR GTT: A1C 7.1on NOB labs, previously on Metformin GBS:   negative GC/CHL: neg/neg Genetics: low-risk female, normal AFP and Horizon Tdap/influenza vaccines: declined tdap, flu this pregnancy   OB History  Gravida Para Term Preterm AB Living  3 1 1   1 1   SAB IAB Ectopic Multiple Live Births  1       1    # Outcome Date GA Lbr Len/2nd Weight Sex Delivery Anes PTL Lv  3 Current           2 Term 08/23/08 [redacted]w[redacted]d    Vag-Spont   LIV  1 SAB 03/26/05 [redacted]w[redacted]d       FD    Medical / Surgical History: Past medical history:  Past  Medical History:  Diagnosis Date  . Diabetes mellitus without complication (HCC)    Type 2  . Hypertension   . Thrombocytosis     Past surgical history:  Past Surgical History:  Procedure Laterality Date  . CHOLECYSTECTOMY     Family History:  Family History  Problem Relation Age of Onset  . Hypertension Mother   . Diabetes Mother   . Diabetes Maternal Grandmother     Social History:  reports that she has never smoked. She has never used smokeless tobacco. She reports previous alcohol use. She reports that she does not use drugs.  Allergies: Losartan   Current Medications at time of admission:  Prior to Admission medications   Medication Sig Start Date End Date Taking? Authorizing Provider  acetaminophen (TYLENOL) 500 MG tablet Take 500 mg by mouth every 6 (six) hours as needed.   Yes [provider]  aspirin 81 MG chewable tablet Chew by mouth daily.   Yes [provider]  ferrous sulfate 325 (65 FE) MG tablet Take 325 mg by mouth daily with breakfast.   Yes [provider]  metFORMIN (GLUCOPHAGE) 500 MG tablet Take 500 mg by mouth 3 (three) times daily.   Yes [provider]  NIFEDIPINE ER  PO Take 30 mg by mouth daily.   Yes [provider]  Prenatal Vit-Fe Fumarate-FA (PRENATAL VITAMINS PO) Take by mouth.   Yes [provider]    Review of Systems: Constitutional: Negative   HENT: Negative   Eyes: Negative   Respiratory: Negative   Cardiovascular: Negative   Gastrointestinal: Negative  Genitourinary: neg for bloody show, neg for LOF   Musculoskeletal: Negative   Skin: Negative   Neurological: Negative   Endo/Heme/Allergies: Negative   Psychiatric/Behavioral: Negative    Physical Exam: VS: Blood pressure 126/72, pulse 85, temperature 98.5 F (36.9 C), temperature source Oral, resp. rate 18, height 5\' 4"  (1.626 m), weight 113.9 kg, last menstrual period 02/01/2020, SpO2 95 %. AAO x3, no signs of  distress Cardiovascular: RRR Respiratory: Lung fields clear to ausculation GU/GI: Abdomen gravid, non-tender, non-distended, active FM, vertex, EFW 9# per Leopold's Extremities: trace edema, negative for pain, tenderness, and cords  Cervical exam:Dilation: 4 Effacement (%): 50 Station: -3 Exam by:: Lamont Dowdy, RN FHR: baseline rate 135 / variability moderate / accelerations present / absent decelerations TOCO: occasional   Prenatal Transfer Tool  Maternal Diabetes: Yes:  Diabetes Type:  Insulin/Medication controlled Genetic Screening: Normal Maternal Ultrasounds/Referrals: Normal Fetal Ultrasounds or other Referrals:  None Maternal Substance Abuse:  No Significant Maternal Medications:  Meds include: Other: Metformin 100mg  PO BID, Procardia XL 30 mg PO daily Significant Maternal Lab Results: Group B Strep negative    Assessment: 40 y.o. G3P1011 [redacted]w[redacted]d IOL for T2DM and CHTN   Latent stage of labor T2DM CHTN FHR category 1 GBS Neg Pain management plan: epidural   Plan:  Admit to L&D Routine admission orders Epidural PRN Pitocin induction Continue Metformin 1000 mg PO BID after delivery Continue Procardia XL 30 mg PO daily CBG Q4 hrs Dr Delora Fuel notified of admission and plan of care  Arrie Eastern MSN, CNM 10/24/2020 11:56 AM

## 2020-10-24 NOTE — Progress Notes (Signed)
Delivery Note Labor onset: 10/24/2020  Labor Onset Time: 1937 Complete dilation at 9:51 PM  Onset of pushing at 2200 FHR second stage Cat 1 Analgesia/Anesthesia intrapartum: IV sedation  Guided pushing with maternal urge. Delivery of a viable female at 2219. Fetal head delivered in LOA position. Nuchal cord none. Infant placed on maternal abd, dried, and tactile stim.  Cord double clamped and cut by [name], Father. [name] present for birth. Cord blood sample collected yes Arterial cord blood sample no.  Placenta delivered [name], intact, with 3 VC.  Placenta to pathology. Uterine tone firm, bleeding minimal  2nd degree laceration identified.  Anesthesia: Local Repair: 2-o Vicryl EBL (mL): 656 Complications: none APGAR: APGAR (1 MIN): 7   APGAR (5 MINS): 8   APGAR (10 MINS):   Mom to postpartum.  Baby to Couplet care / Skin to Skin   Baby girl "Eileen Stevens"  Arrie Eastern MSN, CNM 10/24/2020, 11:13 PM

## 2020-10-24 NOTE — Plan of Care (Signed)
  Problem: Education: Goal: Knowledge of Childbirth will improve Outcome: Progressing Goal: Ability to make informed decisions regarding treatment and plan of care will improve Outcome: Progressing Goal: Ability to state and carry out methods to decrease the pain will improve Outcome: Progressing Goal: Individualized Educational Video(s) Outcome: Progressing   Problem: Coping: Goal: Ability to verbalize concerns and feelings about labor and delivery will improve Outcome: Progressing   Problem: Life Cycle: Goal: Ability to make normal progression through stages of labor will improve Outcome: Progressing Goal: Ability to effectively push during vaginal delivery will improve Outcome: Progressing   Problem: Role Relationship: Goal: Will demonstrate positive interactions with the child Outcome: Progressing   Problem: Safety: Goal: Risk of complications during labor and delivery will decrease Outcome: Progressing   Problem: Pain Management: Goal: Relief or control of pain from uterine contractions will improve Outcome: Progressing   Problem: Education: Goal: Knowledge of General Education information will improve Description: Including pain rating scale, medication(s)/side effects and non-pharmacologic comfort measures Outcome: Progressing   Problem: Health Behavior/Discharge Planning: Goal: Ability to manage health-related needs will improve Outcome: Progressing   Problem: Clinical Measurements: Goal: Ability to maintain clinical measurements within normal limits will improve Outcome: Progressing Goal: Will remain free from infection Outcome: Progressing Goal: Diagnostic test results will improve Outcome: Progressing Goal: Respiratory complications will improve Outcome: Progressing Goal: Cardiovascular complication will be avoided Outcome: Progressing   Problem: Activity: Goal: Risk for activity intolerance will decrease Outcome: Progressing   Problem:  Nutrition: Goal: Adequate nutrition will be maintained Outcome: Progressing   Problem: Coping: Goal: Level of anxiety will decrease Outcome: Progressing   Problem: Elimination: Goal: Will not experience complications related to bowel motility Outcome: Progressing Goal: Will not experience complications related to urinary retention Outcome: Progressing

## 2020-10-24 NOTE — Progress Notes (Signed)
Subjective:    Coping well w/ ctx. Discussed pain med options and pt desires IV sedation and epidural for active labor. Agrees to amniotomy.   Objective:    CBG (last 3)  Recent Labs    10/24/20 1342 10/24/20 1741 10/24/20 2028  GLUCAP 124* 105* 101*    VS: BP 126/72 (BP Location: Right Arm)   Pulse 85   Temp 98.5 F (36.9 C) (Oral)   Resp 18   Ht 5\' 4"  (1.626 m)   Wt 113.9 kg   LMP 02/01/2020   SpO2 95%   BMI 43.08 kg/m  FHR : baseline 135 / variability moderate / accelerations present / absent decelerations Toco: contractions every 2-3 minutes  Membranes: SROM, clear Dilation: 6 Effacement (%): 80 Cervical Position: Middle Station: 0 Exam by:: Burman Foster, CNM Pitocin 32 mU/min, reduced to 16 after amniotomy  Assessment/Plan:   40 y.o. G3P1011 [redacted]w[redacted]d IOL for T2DM and CHTN    - continue CBG Q4 hrs    -normotensive since admission  Labor: Progressing normally and may place IUPC PRN   Preeclampsia:  no signs or symptoms of toxicity Fetal Wellbeing:  Category I Pain Control:  Epidural and IV pain meds I/D:  GBS neg Anticipated MOD:  NSVD  Arrie Eastern MSN, CNM 10/24/2020 7:41 PM

## 2020-10-24 NOTE — Lactation Note (Signed)
This note was copied from a baby's chart. Lactation Consultation Note  Patient Name: Eileen Stevens ALPFX'T Date: 10/24/2020 Reason for consult: L&D Initial assessment Age:39 hours  L&D consult with 34 minutes old infant and P2 mother first-time breastfeeding. Mother has a 12y/o. Congratulated her on her newborn. Infant is skin to skin prone on mother's chest. Discussed STS as ideal transition for infants after birth helping with temperature, blood sugar and comfort. Talked about primal reflexes such as rooting, hands to mouth, searching for the breast among others.   LC assisted with latch to left breast, laid-back position. Explained [place] services availability during postpartum stay. Thanked mother for her time.    Maternal Data Formula Feeding for Exclusion: No Has patient been taught Hand Expression?: Yes Does the patient have breastfeeding experience prior to this delivery?: No  Feeding Feeding Type: Breast Fed  LATCH Score Latch: Repeated attempts needed to sustain latch, nipple held in mouth throughout feeding, stimulation needed to elicit sucking reflex.  Audible Swallowing: A few with stimulation  Type of Nipple: Everted at rest and after stimulation  Comfort (Breast/Nipple): Soft / non-tender  Hold (Positioning): Assistance needed to correctly position infant at breast and maintain latch.  LATCH Score: 7  Interventions Interventions: Assisted with latch;Skin to skin;Breast massage;Hand express;Expressed milk  Lactation Tools Discussed/Used     Consult Status Consult Status: Follow-up Date: 10/24/20 Follow-up type: In-patient    Jarone Ostergaard A Higuera Ancidey 10/24/2020, 11:11 PM

## 2020-10-25 ENCOUNTER — Encounter (HOSPITAL_COMMUNITY): Payer: Self-pay | Admitting: Obstetrics and Gynecology

## 2020-10-25 LAB — GLUCOSE, CAPILLARY
Glucose-Capillary: 288 mg/dL — ABNORMAL HIGH (ref 70–99)
Glucose-Capillary: 198 mg/dL — ABNORMAL HIGH (ref 70–99)
Glucose-Capillary: 124 mg/dL — ABNORMAL HIGH (ref 70–99)
Glucose-Capillary: 107 mg/dL — ABNORMAL HIGH (ref 70–99)
Glucose-Capillary: 144 mg/dL — ABNORMAL HIGH (ref 70–99)

## 2020-10-25 LAB — CBC
HCT: 31.5 % — ABNORMAL LOW (ref 36.0–46.0)
Hemoglobin: 10.6 g/dL — ABNORMAL LOW (ref 12.0–15.0)
MCH: 31.9 pg (ref 26.0–34.0)
MCHC: 33.7 g/dL (ref 30.0–36.0)
MCV: 94.9 fL (ref 80.0–100.0)
Platelets: 382 10*3/uL (ref 150–400)
RBC: 3.32 MIL/uL — ABNORMAL LOW (ref 3.87–5.11)
RDW: 14.6 % (ref 11.5–15.5)
WBC: 12 10*3/uL — ABNORMAL HIGH (ref 4.0–10.5)
nRBC: 0 % (ref 0.0–0.2)

## 2020-10-25 LAB — RPR: RPR Ser Ql: NONREACTIVE

## 2020-10-25 MED ORDER — ONDANSETRON HCL 4 MG PO TABS: 4.0000 mg | ORAL_TABLET | ORAL | Status: DC | PRN

## 2020-10-25 MED ORDER — ACETAMINOPHEN 325 MG PO TABS: 650.0000 mg | ORAL_TABLET | ORAL | Status: DC | PRN

## 2020-10-25 MED ORDER — DIBUCAINE (PERIANAL) 1 % EX OINT: 1.0000 "application " | TOPICAL_OINTMENT | CUTANEOUS | Status: DC | PRN

## 2020-10-25 MED ORDER — ONDANSETRON HCL 4 MG/2ML IJ SOLN: 4.0000 mg | INTRAMUSCULAR | Status: DC | PRN

## 2020-10-25 MED ORDER — SIMETHICONE 80 MG PO CHEW: 80.0000 mg | CHEWABLE_TABLET | ORAL | Status: DC | PRN

## 2020-10-25 MED ORDER — METFORMIN HCL 1000 MG PO TABS: 1000.0000 mg | ORAL_TABLET | Freq: Two times a day (BID) | ORAL | 1 refills | Status: AC

## 2020-10-25 MED ORDER — IBUPROFEN 600 MG PO TABS
600.0000 mg | ORAL_TABLET | Freq: Four times a day (QID) | ORAL | Status: DC
  Administered 2020-10-25 – 2020-10-26 (×6): 600 mg via ORAL
  Filled 2020-10-25 (×6): qty 1

## 2020-10-25 MED ORDER — WITCH HAZEL-GLYCERIN EX PADS: 1.0000 "application " | MEDICATED_PAD | CUTANEOUS | Status: DC | PRN

## 2020-10-25 MED ORDER — PRENATAL MULTIVITAMIN CH
1.0000 | ORAL_TABLET | Freq: Every day | ORAL | Status: DC
  Administered 2020-10-25 – 2020-10-26 (×2): 1 via ORAL
  Filled 2020-10-25 (×2): qty 1

## 2020-10-25 MED ORDER — NIFEDIPINE ER OSMOTIC RELEASE 30 MG PO TB24
30.0000 mg | ORAL_TABLET | Freq: Every day | ORAL | Status: DC
  Administered 2020-10-25 – 2020-10-26 (×2): 30 mg via ORAL
  Filled 2020-10-25 (×2): qty 1

## 2020-10-25 MED ORDER — TETANUS-DIPHTH-ACELL PERTUSSIS 5-2.5-18.5 LF-MCG/0.5 IM SUSY: 0.5000 mL | PREFILLED_SYRINGE | Freq: Once | INTRAMUSCULAR | Status: DC

## 2020-10-25 MED ORDER — BENZOCAINE-MENTHOL 20-0.5 % EX AERO
1.0000 "application " | INHALATION_SPRAY | CUTANEOUS | Status: DC | PRN
  Administered 2020-10-25: 1 via TOPICAL
  Filled 2020-10-25: qty 56

## 2020-10-25 MED ORDER — DIPHENHYDRAMINE HCL 25 MG PO CAPS: 25.0000 mg | ORAL_CAPSULE | Freq: Four times a day (QID) | ORAL | Status: DC | PRN

## 2020-10-25 MED ORDER — SENNOSIDES-DOCUSATE SODIUM 8.6-50 MG PO TABS
2.0000 | ORAL_TABLET | ORAL | Status: DC
  Administered 2020-10-25 – 2020-10-26 (×2): 2 via ORAL
  Filled 2020-10-25 (×2): qty 2

## 2020-10-25 MED ORDER — NIFEDIPINE ER OSMOTIC RELEASE 30 MG PO TB24: 30.0000 mg | ORAL_TABLET | Freq: Every day | ORAL | 1 refills | Status: AC

## 2020-10-25 MED ORDER — METFORMIN HCL 500 MG PO TABS
1000.0000 mg | ORAL_TABLET | Freq: Two times a day (BID) | ORAL | Status: DC
  Administered 2020-10-25 – 2020-10-26 (×3): 1000 mg via ORAL
  Filled 2020-10-25 (×3): qty 2

## 2020-10-25 MED ORDER — COCONUT OIL OIL: 1.0000 "application " | TOPICAL_OIL | Status: DC | PRN

## 2020-10-25 NOTE — Discharge Summary (Incomplete)
SVD OB Discharge Summary     Patient Name: Eileen Stevens DOB: 02/21/1981 MRN: [id number]  Date of admission: 10/24/2020 Delivering MD: Burman Foster B  Date of delivery: 10/25/2019 Type of delivery: SVD  Newborn Data: Sex: Baby female Live born female  Birth Weight: 8 lb 7.5 oz (3841 g) APGAR: 7, 8  Newborn Delivery   Birth date/time: 10/24/2020 22:19:00 Delivery type: Vaginal, Spontaneous      Feeding: breast and bottle Infant being discharge to home with mother in stable condition.   Admitting diagnosis: Pregnancy [Z34.90] Intrauterine pregnancy: [redacted]w[redacted]d     Secondary diagnosis:  Active Problems:   Type 2 diabetes mellitus (HCC)   HPV (human papilloma virus) infection   History of thrombocytosis   Benign essential hypertension   Rh negative, maternal   SVD (1/8)   Normal postpartum course                                Complications: None                                                              Intrapartum Procedures: spontaneous vaginal delivery Postpartum Procedures: none Complications-Operative and Postpartum: 2nd degree perineal laceration Augmentation: AROM and Pitocin   History of Present Illness: Ms. Eileen Stevens is a 40 y.o. female, [id number], who presents at [redacted]w[redacted]d weeks gestation. The patient has been followed at  [facility]  Her pregnancy has been complicated by:  Patient Active Problem List   Diagnosis Date Noted  . Normal postpartum course 10/25/2020  . History of thrombocytosis 10/24/2020  . Benign essential hypertension 10/24/2020  . Rh negative, maternal 10/24/2020  . SVD (1/8) 10/24/2020  . Type 2 diabetes mellitus ([place])   . 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    Hospital course:  Induction of Labor With Vaginal Delivery   40 y.o. yo [id number] at [redacted]w[redacted]d was admitted to the hospital 10/24/2020 for induction of labor.  Indication for induction: TYPE 2 DM and CHTN.  Patient had an uncomplicated labor  course as follows: Membrane Rupture Time/Date: 7:37 PM ,10/24/2020   Delivery Method:Vaginal, Spontaneous  Episiotomy: None  Lacerations:  2nd degree  Details of delivery can be found in separate delivery note.  Patient had a routine postpartum course. Patient is discharged home 10/26/20.  Newborn Data: Birth date:10/24/2020  Birth time:10:19 PM  Gender:Female  Living status:Living  Apgars:7 ,8  Weight:3841 g  Postpartum Day # 2 : S/P NSVD due to Eileen Stevens a 40 y.o.femaleG3P1011 [redacted]w[redacted]d presenting for IOL for T2DM controlled on Metformin 1000 mg PO BID, CHTN on Procardia 30XL since 17+5 wks. Anterioruterinefibroid 2 x 1.32 x 2.31noted on [place] @ 29 wks. Covid pos 12/25and treated w/monoclonal antibodies, pt had beenvaccinatedfor Covid during her2nd trimester. Patient up ad lib, denies syncope or dizziness. Pt progress with AROM and pitocin, had 2nd laceration repaired, EBL was 224mls, hgb drop from 13-10.6, stable. CBG uncontrolled PP with Fasting of 198, and highest PP 288, diet changed to diabetic diet and pt restarted on metformin 1000mg  PO BID on 1-9, fasting this morning was 76 and PP improved yesterday with diet and medicaiton, CHTN controlled BP currently 111/58, denies HA RUQ pain or vision  changes, procardia order 1/9 due to elevated BP yesterday but hold parameter placed. Reports consuming regular diet without issues and denies N/V. Patient reports 0 bowel movement + passing flatus.  Denies issues with urination and reports bleeding is "lighter."  Patient is breat and bottle feeding and reports going well.  Desires undecided for postpartum contraception.  Pain is being appropriately managed with use of po meds.  Physical exam  Vitals:   10/25/20 1933 10/26/20 0002 10/26/20 0500 10/26/20 0852  BP: 136/71 (!) 110/54 (!) 111/58 118/73  Pulse: 72   66  Resp: 18  16   Temp: 98 F (36.7 C)  98.2 F (36.8 C)   TempSrc: Oral  Oral   SpO2: 100%  99%   Weight:      Height:        General: alert, cooperative and no distress Lochia: appropriate Uterine Fundus: firm Perineum: approximate, no hematomas noted DVT Evaluation: No evidence of DVT seen on physical exam. Negative Homan's sign. No cords or calf tenderness. No significant calf/ankle edema.  Labs: Lab Results  Component Value Date   WBC 12.0 (H) 10/25/2020   HGB 10.6 (L) 10/25/2020   HCT 31.5 (L) 10/25/2020   MCV 94.9 10/25/2020   PLT 382 10/25/2020   CMP Latest Ref Rng & Units 10/10/2020  Glucose 70 - 99 mg/dL 136(H)  BUN 6 - 20 mg/dL 6  Creatinine 0.44 - 1.00 mg/dL 0.61  Sodium 135 - 145 mmol/L 131(L)  Potassium 3.5 - 5.1 mmol/L 4.0  Chloride 98 - 111 mmol/L 102  CO2 22 - 32 mmol/L 20(L)  Calcium 8.9 - 10.3 mg/dL 9.3  Total Protein 6.5 - 8.1 g/dL 7.4  Total Bilirubin 0.3 - 1.2 mg/dL 0.3  Alkaline Phos 38 - 126 U/L 96  AST 15 - 41 U/L 15  ALT 0 - 44 U/L 12    Date of discharge: 10/26/2020 Discharge Diagnoses: Term Pregnancy-delivered Discharge instruction: per After Visit Summary and "Baby and Me Booklet".  After visit meds:   Activity:           unrestricted and pelvic rest Advance as tolerated. Pelvic rest for 6 weeks.  Diet:                routine Medications: Tylenol, metformin, procardia, PNV Postpartum contraception: Undecided Condition:  Pt discharge to home with baby in stable CHTN: One week telehealth visit for BP check, and do not take procardia 30mg  xl daily if BP<110/70, pt verbilizied understanding. DM2: Redraw hga1c at 6 weeks visit to reevaluate metformin, may switch back to other dm medication once not breastfeeding. Monitor for s/sx of hyper or hypoglycemia. Continue metformin 1000mg  PO BID and diet with exercise.    Meds: Allergies as of 10/26/2020      Reactions   Losartan Cough   Cough      Medication List    STOP taking these medications   aspirin 81 MG chewable tablet   NIFEDIPINE ER PO Replaced by: NIFEdipine 30 MG 24 hr tablet     TAKE these  medications   acetaminophen 500 MG tablet Commonly known as: TYLENOL Take 500 mg by mouth every 6 (six) hours as needed.   ferrous sulfate 325 (65 FE) MG tablet Take 325 mg by mouth daily with breakfast.   metFORMIN 1000 MG tablet Commonly known as: GLUCOPHAGE Take 1 tablet (1,000 mg total) by mouth 2 (two) times daily with a meal. What changed:   medication strength  how much to take  when to take this   NIFEdipine 30 MG 24 hr tablet Commonly known as: PROCARDIA-XL/NIFEDICAL-XL Take 1 tablet (30 mg total) by mouth daily. Replaces: NIFEDIPINE ER PO   PRENATAL VITAMINS PO Take by mouth.       Discharge Follow Up:   Follow-up [facility]. Schedule an appointment as soon as possible for a visit in 1 week(s).   Specialty: Obstetrics and Gynecology Why: 1 week BP check, may be telehealth if you have a BP cuff at home, and 6 week PPV Contact information: [address] [place] [address] [place], NP-C, CNM 10/26/2020, 12:20 PM  Noralyn Pick,

## 2020-10-25 NOTE — Progress Notes (Signed)
Subjective: Postpartum Day # 1 : S/P NSVD due to Eileen Stevens is a 40 y.o. female G3P1011 [redacted]w[redacted]d presenting for IOL for T2DM controlled on Metformin 1000 mg PO BID, CHTN on Procardia 30XL since 17+5 wks. Anterior uterine fibroid 2 x 1.32 x 2.31 noted on [place] @ 29 wks. Covid pos 12/25 and treated w/ monoclonal antibodies, pt had been vaccinated for Covid during her 2nd trimester. Patient up ad lib, denies syncope or dizziness. Pt progress with AROM and pitocin, had 2nd laceration repaired, EBL was 266mls, hgb drop from 13-10.6, stable. CBG uncontrolled PP with Fasting of 198, diet changed to diabetic diet and pt restarted on metformin 1000mg  PO BID, CHTN controlled BP 103/63, denies HA RUQ pain or vision chaces, procardia order due to elevated BP yesterday but hold parameter placed. Reports consuming regular diet without issues and denies N/V. Patient reports 0 bowel movement + passing flatus.  Denies issues with urination and reports bleeding is "lighter."  Patient is breat and bottle feeding and reports going well.  Desires undecided for postpartum contraception.  Pain is being appropriately managed with use of po meds.   2nd laceration Feeding:  Breast and bottle Contraceptive plan:  Undecided Baby female  Objective: Vital signs in last 24 hours: Patient Vitals for the past 24 hrs:  BP Temp Temp src Pulse Resp SpO2 Height Weight  10/25/20 0546 103/63 98.5 F (36.9 C) Oral 81 20 100 % -- --  10/25/20 0140 120/68 98.4 F (36.9 C) Oral 84 20 100 % -- --  10/25/20 0035 (!) 135/50 98.1 F (36.7 C) Oral 99 20 100 % -- --  10/24/20 2348 (!) 133/58 -- -- 80 17 -- -- --  10/24/20 2330 131/67 -- -- 92 16 -- -- --  10/24/20 2315 (!) 123/58 -- -- 89 17 -- -- --  10/24/20 2300 104/84 -- -- 84 18 -- -- --  10/24/20 2246 (!) 141/65 -- -- 89 17 -- -- --  10/24/20 2031 130/67 (!) 97.3 F (36.3 C) Axillary 74 20 -- -- --  10/24/20 1015 126/72 98.5 F (36.9 C) Oral 85 18 95 % 5\' 4"  (1.626 m) 113.9 kg      Physical Exam:  General: alert, cooperative, appears stated age and no distress Mood/Affect: Happy Lungs: clear to auscultation, no wheezes, rales or rhonchi, symmetric air entry.  Heart: normal rate, regular rhythm, normal S1, S2, no murmurs, rubs, clicks or gallops. Breast: breasts appear normal, no suspicious masses, no skin or nipple changes or axillary nodes. Abdomen:  + bowel sounds, soft, non-tender GU: perineum approximate, healing well. No signs of external hematomas.  Uterine Fundus: firm Lochia: appropriate Skin: Warm, Dry. DVT Evaluation: No evidence of DVT seen on physical exam. Negative Homan's sign. No cords or calf tenderness. No significant calf/ankle edema.  CBC Latest Ref Rng & Units 10/25/2020 10/24/2020 10/24/2020  WBC 4.0 - 10.5 K/uL 12.0(H) 9.1 5.9  Hemoglobin 12.0 - 15.0 g/dL 10.6(L) 13.0 9.3(L)  Hematocrit 36.0 - 46.0 % 31.5(L) 41.1 28.2(L)  Platelets 150 - 400 K/uL 382 375 342    Results for orders placed or performed during the hospital encounter of 10/24/20 (from the past 24 hour(s))  CBC     Status: Abnormal   Collection Time: 10/24/20 11:36 AM  Result Value Ref Range   WBC 5.9 4.0 - 10.5 K/uL   RBC 2.92 (L) 3.87 - 5.11 MIL/uL   Hemoglobin 9.3 (L) 12.0 - 15.0 g/dL   HCT 28.2 (L) 36.0 - 46.0 %  MCV 96.6 80.0 - 100.0 fL   MCH 31.8 26.0 - 34.0 pg   MCHC 33.0 30.0 - 36.0 g/dL   RDW 14.9 11.5 - 15.5 %   Platelets 342 150 - 400 K/uL   nRBC 0.0 0.0 - 0.2 %  Type and screen [place]     Status: None   Collection Time: 10/24/20 11:36 AM  Result Value Ref Range   ABO/RH(D) A NEG    Antibody Screen POS    Sample Expiration 10/27/2020,2359    Antibody Identification      PASSIVELY ACQUIRED ANTI-D Performed at [facility], [place] [address] [place], [place] [address]   ABO/Rh     Status: None   Collection Time: 10/24/20 12:54 PM  Result Value Ref Range   ABO/RH(D)      A NEG Performed at [facility] [address], [place], [place] [address]   Glucose, capillary     Status: Abnormal   Collection Time: 10/24/20  1:42 PM  Result Value Ref Range   Glucose-Capillary 124 (H) 70 - 99 mg/dL  Glucose, capillary     Status: Abnormal   Collection Time: 10/24/20  5:41 PM  Result Value Ref Range   Glucose-Capillary 105 (H) 70 - 99 mg/dL  Glucose, capillary     Status: Abnormal   Collection Time: 10/24/20  8:28 PM  Result Value Ref Range   Glucose-Capillary 101 (H) 70 - 99 mg/dL  CBC     Status: Abnormal   Collection Time: 10/24/20  9:20 PM  Result Value Ref Range   WBC 9.1 4.0 - 10.5 K/uL   RBC 3.99 3.87 - 5.11 MIL/uL   Hemoglobin 13.0 12.0 - 15.0 g/dL   HCT 41.1 36.0 - 46.0 %   MCV 103.0 (H) 80.0 - 100.0 fL   MCH 32.6 26.0 - 34.0 pg   MCHC 31.6 30.0 - 36.0 g/dL   RDW 15.1 11.5 - 15.5 %   Platelets 375 150 - 400 K/uL   nRBC 0.0 0.0 - 0.2 %  Glucose, capillary     Status: Abnormal   Collection Time: 10/25/20  1:57 AM  Result Value Ref Range   Glucose-Capillary 288 (H) 70 - 99 mg/dL  CBC     Status: Abnormal   Collection Time: 10/25/20  5:25 AM  Result Value Ref Range   WBC 12.0 (H) 4.0 - 10.5 K/uL   RBC 3.32 (L) 3.87 - 5.11 MIL/uL   Hemoglobin 10.6 (L) 12.0 - 15.0 g/dL   HCT 31.5 (L) 36.0 - 46.0 %   MCV 94.9 80.0 - 100.0 fL   MCH 31.9 26.0 - 34.0 pg   MCHC 33.7 30.0 - 36.0 g/dL   RDW 14.6 11.5 - 15.5 %   Platelets 382 150 - 400 K/uL   nRBC 0.0 0.0 - 0.2 %  Glucose, capillary     Status: Abnormal   Collection Time: 10/25/20  5:58 AM  Result Value Ref Range   Glucose-Capillary 198 (H) 70 - 99 mg/dL     CBG (last 3)  Recent Labs    10/24/20 2028 10/25/20 0157 10/25/20 0558  GLUCAP 101* 288* 198*     I/O last 3 completed shifts: In: -  Out: 250 [Blood:250]   Assessment Postpartum Day # 1 : S/P NSVD due to Eileen Stevens is a 40 y.o. female G3P1011 [redacted]w[redacted]d presenting for IOL for T2DM controlled on Metformin 1000 mg PO BID, CHTN on Procardia 30XL since  17+5 wks. Anterior uterine  fibroid 2 x 1.32 x 2.31 noted on [place] @ 29 wks. Covid pos 12/25 and treated w/ monoclonal antibodies, pt had been vaccinated for Covid during her 2nd trimester. Patient up ad lib, denies syncope or dizziness. Pt progress with AROM and pitocin, had 2nd laceration repaired, EBL was 274mls, hgb drop from 13-10.6, stable. CBG uncontrolled PP with Fasting of 198, diet changed to diabetic diet and pt restarted on metformin 1000mg  PO BID, CHTN controlled BP 103/63, denies HA RUQ pain or vision chaces, procardia order due to elevated BP yesterday but hold parameter placed. Pt stable. -1 involution. Breast and bottle feeding. Hemodynamically stable.   Plan: Continue other mgmt as ordered CHTN: Ordered procardia 30mg  XL PO with do not give if BP <110/70, monitor BP Q4H DM2: Restarted metformin 1000mg  PO BID, low carb diet, monitor CBG 4times daily with meal and HS with fasting. If CBG continue ot be elevated may need sliding scale insulin coverage. VTE prophylactics: Early ambulated as tolerates.  Pain control: Motrin/Tylenol PRN Education given regarding options for contraception, including barrier methods, injectable contraception, IUD placement, oral contraceptives.  Plan for discharge tomorrow, Breastfeeding and Lactation consult   Dr. Delora Fuel to be updated on patient status  [facility] NP-C, CNM 10/25/2020, 7:35 AM

## 2020-10-25 NOTE — Lactation Note (Signed)
This note was copied from a baby's chart. Lactation Consultation Note  Patient Name: Eileen Stevens [id number] Date: 10/25/2020 Reason for consult: Follow-up assessment Age:39 hours   P3 mother whose infant is now 28 hours old.  This is an ETI at 38+0 weeks.  Mother's first child is not living.  Mother did not breast feed her 2nd child (now 46 years old).    Per RN, mother has been formula feeding only due to hypoglycemia with the baby.  Baby's blood sugars have been 43 mg/dl, 35 mg/dl and 40 mg/dl.  The last blood sugar was resulted at 0907.  Another blood sugar result is pending.  As baby's blood sugar levels increase informed mother that she can allow baby to breast feed first and supplement after (per MD/NP orders).  Offered to initiate the DEBP to help stimulate breasts and mother agreeable.  Pump parts, assembly, disassembly and cleaning reviewed.  Observed mother pumping and the #24 flange size is appropriate.  Educated mother on how to observe and change flange size if needed.  Mother will continue hand expression before/after pumping and finger feed any EBM she obtains to baby.  Colostrum container provided and milk storage times reviewed.    Mother has [employer] and is applying for [facility].  Suggested she call her insurance company today to determine eligibility for a DEBP.  [facility] referral faxed with mother's permission.  Mother plans to call [facility] tomorrow a.m. when the office opens to determine eligibility.    Encouraged mother to call her RN/LC for latch assistance as needed.  No support person here at this time, however, father will return later today.  RN updated.    Maternal Data    Feeding Feeding Type: Bottle Fed - Formula Nipple Type: Slow - flow  LATCH Score                   Interventions    Lactation Tools Discussed/Used     Consult Status Consult Status: Follow-up Date: 10/26/20 Follow-up type: In-patient    Shirin Echeverry R Bowie Delia 10/25/2020, 1:50  PM

## 2020-10-25 NOTE — Hospital Course (Signed)
Assessment Postpartum Day # 1 : S/P NSVD due to Eileen Stevens is a 40 y.o. female G3P1011 [redacted]w[redacted]d presenting for IOL for T2DM controlled on Metformin 1000 mg PO BID, CHTN on Procardia 30XL since 17+5 wks. Anterior uterine fibroid 2 x 1.32 x 2.31 noted on [place] @ 29 wks. Covid pos 12/25 and treated w/ monoclonal antibodies, pt had been vaccinated for Covid during her 2nd trimester. Patient up ad lib, denies syncope or dizziness. Pt progress with AROM and pitocin, had 2nd laceration repaired, EBL was 291mls, hgb drop from 13-10.6, stable. CBG uncontrolled PP with Fasting of 198, diet changed to diabetic diet and pt restarted on metformin 1000mg  PO BID, CHTN controlled BP 103/63, denies HA RUQ pain or vision chaces, procardia order due to elevated BP yesterday but hold parameter placed. Pt stable. -1 involution. Breast and bottle feeding. Hemodynamically stable.    Plan: Continue other mgmt as ordered CHTN: Ordered procardia 30mg  XL PO with do not give if BP <110/70, monitor BP Q4H DM2: Restarted metformin 1000mg  PO BID, low carb diet, monitor CBG 4times daily with meal and HS with fasting. If CBG continue ot be elevated may need sliding scale insulin coverage. VTE prophylactics: Early ambulated as tolerates.  Pain control: Motrin/Tylenol PRN Education given regarding options for contraception, including barrier methods, injectable contraception, IUD placement, oral contraceptives.  Plan for discharge tomorrow, Breastfeeding and Lactation consult

## 2020-10-26 LAB — GLUCOSE, CAPILLARY: Glucose-Capillary: 79 mg/dL (ref 70–99)

## 2020-10-26 NOTE — Lactation Note (Signed)
This note was copied from a baby's chart. Lactation Consultation Note  Patient Name: Eileen Stevens [id number] Date: 10/26/2020 Reason for consult: Follow-up assessment Age:39 hours  Mother states she has had difficulty latching baby and she prefers to pump and bottle feed. She had spoken with [facility] to obtain a DEBP. Encouraged mother to pump at least 8x per day. Reviewed engorgement care and monitoring voids/stools.   Feeding Feeding Type: Bottle Fed - Formula Nipple Type: Slow - flow  LATCH Score                   Interventions Interventions: DEBP  Lactation Tools Discussed/Used Pump Education: Milk Storage   Consult Status Consult Status: Complete Date: 10/26/20    [name] [facility] 10/26/2020, 10:26 AM

## 2020-10-28 LAB — SURGICAL PATHOLOGY

## 2021-02-23 ENCOUNTER — Emergency Department (HOSPITAL_BASED_OUTPATIENT_CLINIC_OR_DEPARTMENT_OTHER)
Admission: EM | Admit: 2021-02-23 | Discharge: 2021-02-23 | Disposition: A | Discharge status: Home / Self Care | Payer: Medicaid Other | Attending: Emergency Medicine | Admitting: Emergency Medicine

## 2021-02-23 ENCOUNTER — Other Ambulatory Visit: Payer: Self-pay

## 2021-02-23 ENCOUNTER — Encounter (HOSPITAL_BASED_OUTPATIENT_CLINIC_OR_DEPARTMENT_OTHER): Payer: Self-pay | Admitting: Emergency Medicine

## 2021-02-23 DIAGNOSIS — I10 Essential (primary) hypertension: Secondary | ICD-10-CM | POA: Diagnosis not present

## 2021-02-23 DIAGNOSIS — Z20822 Contact with and (suspected) exposure to covid-19: Secondary | ICD-10-CM | POA: Diagnosis not present

## 2021-02-23 DIAGNOSIS — R112 Nausea with vomiting, unspecified: Secondary | ICD-10-CM | POA: Diagnosis not present

## 2021-02-23 DIAGNOSIS — R1084 Generalized abdominal pain: Secondary | ICD-10-CM | POA: Insufficient documentation

## 2021-02-23 DIAGNOSIS — R197 Diarrhea, unspecified: Secondary | ICD-10-CM | POA: Diagnosis not present

## 2021-02-23 DIAGNOSIS — R509 Fever, unspecified: Secondary | ICD-10-CM | POA: Insufficient documentation

## 2021-02-23 DIAGNOSIS — Z21 Asymptomatic human immunodeficiency virus [HIV] infection status: Secondary | ICD-10-CM | POA: Diagnosis not present

## 2021-02-23 DIAGNOSIS — Z7984 Long term (current) use of oral hypoglycemic drugs: Secondary | ICD-10-CM | POA: Diagnosis not present

## 2021-02-23 DIAGNOSIS — E119 Type 2 diabetes mellitus without complications: Secondary | ICD-10-CM | POA: Diagnosis not present

## 2021-02-23 DIAGNOSIS — Z79899 Other long term (current) drug therapy: Secondary | ICD-10-CM | POA: Insufficient documentation

## 2021-02-23 LAB — URINALYSIS, ROUTINE W REFLEX MICROSCOPIC
Bilirubin Urine: NEGATIVE
Glucose, UA: >1000 mg/dL — AB
Ketones, ur: 15 mg/dL — AB
Leukocytes,Ua: NEGATIVE
Nitrite: NEGATIVE
Specific Gravity, Urine: 1.036 — ABNORMAL HIGH (ref 1.005–1.030)
pH: 5.5 (ref 5.0–8.0)

## 2021-02-23 LAB — CBC WITH DIFFERENTIAL/PLATELET
Abs Immature Granulocytes: 0.03 10*3/uL (ref 0.00–0.07)
Basophils Absolute: 0 10*3/uL (ref 0.0–0.1)
Basophils Relative: 0 %
Eosinophils Absolute: 0.3 10*3/uL (ref 0.0–0.5)
Eosinophils Relative: 4 %
HCT: 39.9 % (ref 36.0–46.0)
Hemoglobin: 13.7 g/dL (ref 12.0–15.0)
Immature Granulocytes: 0 %
Lymphocytes Relative: 3 %
Lymphs Abs: 0.2 10*3/uL — ABNORMAL LOW (ref 0.7–4.0)
MCH: 31.6 pg (ref 26.0–34.0)
MCHC: 34.3 g/dL (ref 30.0–36.0)
MCV: 91.9 fL (ref 80.0–100.0)
Monocytes Absolute: 0.3 10*3/uL (ref 0.1–1.0)
Monocytes Relative: 4 %
Neutro Abs: 6.4 10*3/uL (ref 1.7–7.7)
Neutrophils Relative %: 89 %
Platelets: 364 10*3/uL (ref 150–400)
RBC: 4.34 MIL/uL (ref 3.87–5.11)
RDW: 12.9 % (ref 11.5–15.5)
WBC: 7.2 10*3/uL (ref 4.0–10.5)
nRBC: 0 % (ref 0.0–0.2)

## 2021-02-23 LAB — COMPREHENSIVE METABOLIC PANEL
ALT: 56 U/L — ABNORMAL HIGH (ref 0–44)
AST: 54 U/L — ABNORMAL HIGH (ref 15–41)
Albumin: 4 g/dL (ref 3.5–5.0)
Alkaline Phosphatase: 66 U/L (ref 38–126)
Anion gap: 12 (ref 5–15)
BUN: 16 mg/dL (ref 6–20)
CO2: 23 mmol/L (ref 22–32)
Calcium: 8.9 mg/dL (ref 8.9–10.3)
Chloride: 100 mmol/L (ref 98–111)
Creatinine, Ser: 0.7 mg/dL (ref 0.44–1.00)
GFR, Estimated: >60 mL/min (ref >60)
Glucose, Bld: 324 mg/dL — ABNORMAL HIGH (ref 70–99)
Potassium: 3.7 mmol/L (ref 3.5–5.1)
Sodium: 135 mmol/L (ref 135–145)
Total Bilirubin: 0.6 mg/dL (ref 0.3–1.2)
Total Protein: 7.9 g/dL (ref 6.5–8.1)

## 2021-02-23 LAB — RESP PANEL BY RT-PCR (FLU A&B, COVID) ARPGX2
Influenza A by PCR: NEGATIVE
Influenza B by PCR: NEGATIVE
SARS Coronavirus 2 by RT PCR: NEGATIVE

## 2021-02-23 LAB — LIPASE, BLOOD: Lipase: 15 U/L (ref 11–51)

## 2021-02-23 LAB — PREGNANCY, URINE: Preg Test, Ur: NEGATIVE

## 2021-02-23 MED ORDER — LOPERAMIDE HCL 2 MG PO CAPS: 2.0000 mg | ORAL_CAPSULE | Freq: Four times a day (QID) | ORAL | 0 refills | Status: DC | PRN

## 2021-02-23 MED ORDER — ONDANSETRON HCL 4 MG/2ML IJ SOLN
4.0000 mg | Freq: Once | INTRAMUSCULAR | Status: AC
  Administered 2021-02-23: 4 mg via INTRAVENOUS
  Filled 2021-02-23: qty 2

## 2021-02-23 MED ORDER — SODIUM CHLORIDE 0.9 % IV BOLUS
1000.0000 mL | Freq: Once | INTRAVENOUS | Status: AC
  Administered 2021-02-23: 1000 mL via INTRAVENOUS

## 2021-02-23 MED ORDER — ONDANSETRON HCL 4 MG PO TABS: 4.0000 mg | ORAL_TABLET | Freq: Three times a day (TID) | ORAL | 0 refills | Status: DC | PRN

## 2021-02-23 MED ORDER — ONDANSETRON HCL 4 MG PO TABS: 4.0000 mg | ORAL_TABLET | Freq: Three times a day (TID) | ORAL | 0 refills | Status: AC | PRN

## 2021-02-23 MED ORDER — LOPERAMIDE HCL 2 MG PO CAPS: 2.0000 mg | ORAL_CAPSULE | Freq: Four times a day (QID) | ORAL | 0 refills | Status: AC | PRN

## 2021-02-23 NOTE — ED Triage Notes (Signed)
Pt reports abd pain , NV. Started today.

## 2021-02-23 NOTE — Discharge Instructions (Signed)
You were seen in the emergency department for acute onset of nausea vomiting diarrhea and abdominal pain.  Your lab work showed your sugar to be elevated along with mild elevation of some of your liver tests.  There is a hepatitis panel pending at time of discharge.  We are sending prescriptions to your pharmacy for nausea medication and diarrhea medication.  Please follow-up with your primary care doctor.  Return to the emergency department for any worsening or concerning symptoms

## 2021-02-23 NOTE — ED Provider Notes (Signed)
Patient was initially seen by Dr. Melina Copa.  Please see his note.  Patient is feeling better after treatment.  She has been able to tolerate fluids.  She feels ready for discharge   Dorie Rank, MD 02/23/21 [phone]

## 2021-02-23 NOTE — ED Provider Notes (Signed)
[facility] EMERGENCY DEPT Provider Note   CSN: [id number] Arrival date & time: 02/23/21  1325     History Chief Complaint  Patient presents with  . Abdominal Pain    Eileen Stevens is a 40 y.o. female.  She has a history of diabetes.  Complaining of acute onset of nausea vomiting diarrhea and abdominal crampy pain that started early this morning.  She said she has had 5 episodes of vomiting and 5 episodes of diarrhea.  Nonbloody.  Had a low-grade temperature on arrival to triage.  Feels weak all over.  Works [employer] and has had multiple kids sick with vomiting.  Last menstrual period is now. Covid vaccinated, not boosted.  The history is provided by the patient.  Abdominal Pain Pain location:  Generalized Pain quality: cramping   Pain radiates to:  Does not radiate Pain severity:  Moderate Onset quality:  Sudden Timing:  Intermittent Progression:  Improving Chronicity:  New Context: sick contacts   Context: not trauma   Relieved by:  Nothing Worsened by:  Nothing Ineffective treatments:  None tried Associated symptoms: diarrhea, fever, nausea and vomiting   Associated symptoms: no chest pain, no constipation, no cough, no dysuria, no shortness of breath and no sore throat        Past Medical History:  Diagnosis Date  . Diabetes mellitus without complication (HCC)    Type 2  . Hypertension   . Thrombocytosis     Patient Active Problem List   Diagnosis Date Noted  . Normal postpartum course 10/25/2020  . History of thrombocytosis 10/24/2020  . Benign essential hypertension 10/24/2020  . Rh negative, maternal 10/24/2020  . SVD (1/8) 10/24/2020  . Type 2 diabetes mellitus ([place])   . 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 CHOLECYSTECTOMY       OB History    Gravida  3   Para  2   Term  2   Preterm      AB  1   Living  2     SAB  1   IAB      Ectopic       Multiple  0   Live Births  2           Family History  Problem Relation Age of Onset  . Hypertension Mother   . Diabetes Mother   . Diabetes Maternal Grandmother     Social History   Tobacco Use  . Smoking status: Never Smoker  . Smokeless tobacco: Never Used  Vaping Use  . Vaping Use: Never used  Substance Use Topics  . Alcohol use: Not Currently  . Drug use: Never    Home Medications Prior to Admission medications   Medication Sig Start Date End Date Taking? Authorizing Provider  acetaminophen (TYLENOL) 500 MG tablet Take 500 mg by mouth every 6 (six) hours as needed.    [provider]  ferrous sulfate 325 (65 FE) MG tablet Take 325 mg by mouth daily with breakfast.    [provider]  metFORMIN (GLUCOPHAGE) 1000 MG tablet Take 1 tablet (1,000 mg total) by mouth 2 (two) times daily with a meal. 10/25/20   Miller, Luvenia Starch, FNP  NIFEdipine (PROCARDIA-XL/NIFEDICAL-XL) 30 MG 24 hr tablet Take 1 tablet (30 mg total) by mouth daily. 10/26/20   Noralyn Pick, [place]  Prenatal Vit-Fe Fumarate-FA (PRENATAL VITAMINS PO) Take by mouth.    [provider]  Allergies    Losartan  Review of Systems   Review of Systems  Constitutional: Positive for fever.  HENT: Negative for sore throat.   Eyes: Negative for visual disturbance.  Respiratory: Negative for cough and shortness of breath.   Cardiovascular: Negative for chest pain.  Gastrointestinal: Positive for abdominal pain, diarrhea, nausea and vomiting. Negative for constipation.  Genitourinary: Negative for dysuria.  Musculoskeletal: Negative for arthralgias.  Skin: Negative for rash.  Neurological: Negative for headaches.    Physical Exam Updated Vital Signs BP 133/83 (BP Location: Right Arm)   Pulse (!) 122   Temp (!) 100.4 F (38 C) (Oral)   Resp 20   Ht 5\' 4"  (1.626 m)   Wt 107 kg   LMP 02/18/2021   SpO2 95%   BMI 40.51 kg/m   Physical Exam Vitals and nursing note reviewed.   Constitutional:      General: She is not in acute distress.    Appearance: Normal appearance. She is well-developed.  HENT:     Head: Normocephalic and atraumatic.  Eyes:     Conjunctiva/sclera: Conjunctivae normal.  Cardiovascular:     Rate and Rhythm: Normal rate and regular rhythm.     Heart sounds: No murmur heard.   Pulmonary:     Effort: Pulmonary effort is normal. No respiratory distress.     Breath sounds: Normal breath sounds.  Abdominal:     Palpations: Abdomen is soft.     Tenderness: There is no abdominal tenderness. There is no guarding or rebound.  Musculoskeletal:        General: No deformity or signs of injury. Normal range of motion.     Cervical back: Neck supple.  Skin:    General: Skin is warm and dry.     Capillary Refill: Capillary refill takes less than 2 seconds.  Neurological:     General: No focal deficit present.     Mental Status: She is alert.     ED Results / Procedures / Treatments   Labs (all labs ordered are listed, but only abnormal results are displayed) Labs Reviewed  COMPREHENSIVE METABOLIC PANEL - Abnormal; Notable for the following components:      Result Value   Glucose, Bld 324 (*)    AST 54 (*)    ALT 56 (*)    All other components within normal limits  CBC WITH DIFFERENTIAL/PLATELET - Abnormal; Notable for the following components:   Lymphs Abs 0.2 (*)    All other components within normal limits  URINALYSIS, ROUTINE W REFLEX MICROSCOPIC - Abnormal; Notable for the following components:   Specific Gravity, Urine 1.036 (*)    Glucose, UA >1,000 (*)    Hgb urine dipstick LARGE (*)    Ketones, ur 15 (*)    Protein, ur TRACE (*)    All other components within normal limits  RESP PANEL BY RT-PCR (FLU A&B, COVID) ARPGX2  LIPASE, BLOOD  PREGNANCY, URINE  HEPATITIS PANEL, ACUTE    EKG None  Radiology No results found.  Procedures Procedures   Medications Ordered in ED Medications  sodium chloride 0.9 % bolus 1,000  mL (0 mLs Intravenous Stopped 02/23/21 1540)  ondansetron (ZOFRAN) injection 4 mg (4 mg Intravenous Given 02/23/21 1434)    ED Course  I have reviewed the triage vital signs and the nursing notes.  Pertinent labs & imaging results that were available during my care of the patient were reviewed by me and considered in my medical decision making (see  chart for details).    MDM Rules/Calculators/A&P                         This patient complains of nausea vomiting diarrhea and crampy abdominal pain; this involves an extensive number of treatment Options and is a complaint that carries with it a high risk of complications and Morbidity. The differential includes acute gastroenteritis, colitis, diverticulitis, obstruction, less likely C. difficile, COVID, DKA, hepatitis  I ordered, reviewed and interpreted labs, which included CBC with normal white count normal hemoglobin, chemistries  normal other than elevated glucose, Normal,LFTs other thanmild elevation AST and ALT, Pregnancy test negative, COVID testing negative, urinalysis with some red cells, patient on. I ordered medication IV fluids and nausea medication Previous records obtained and reviewed in epic, no recent admissions  After the interventions stated above, I reevaluated the patient and found patient symptomatically improved.  Tachycardia improving also.  Signed out to oncoming provider to reassess after finishing up her IV fluids and p.o. trial.  Likely can be discharged home with symptomatic treatment on some Zofran and Imodium.   Final Clinical Impression(s) / ED Diagnoses Final diagnoses:  Nausea vomiting and diarrhea    Rx / DC Orders ED Discharge Orders    None       Hayden Rasmussen, MD 02/23/21 1729

## 2021-02-24 LAB — HEPATITIS PANEL, ACUTE
HCV Ab: NONREACTIVE
Hep A IgM: NONREACTIVE
Hep B C IgM: NONREACTIVE
Hepatitis B Surface Ag: NONREACTIVE

## 2022-08-13 IMAGING — US US MFM OB DETAIL+14 WK
1 series · 13 of 28 positions shown · non-contrast
Comparison: none

[Series 1: us mfm ob detail+14 wk · 97 acquisitions, 13 frames shown]
[im 4/97]
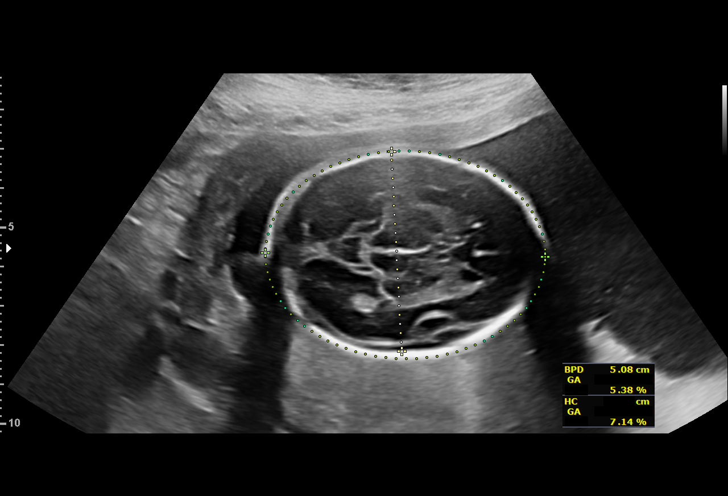
[im 11/97]
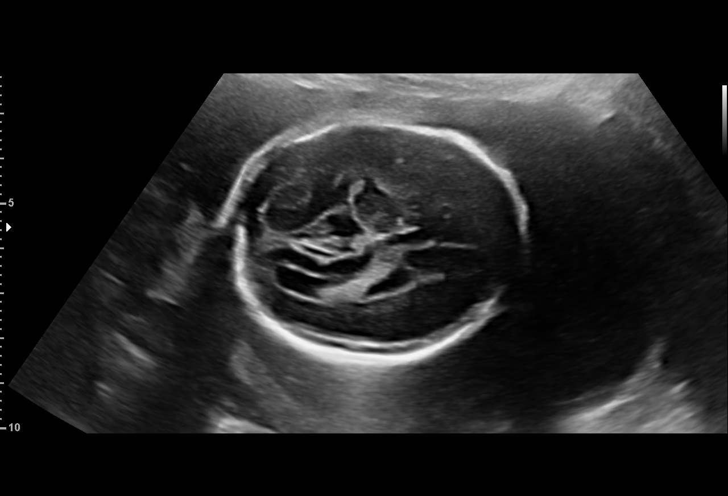
[im 18/97]
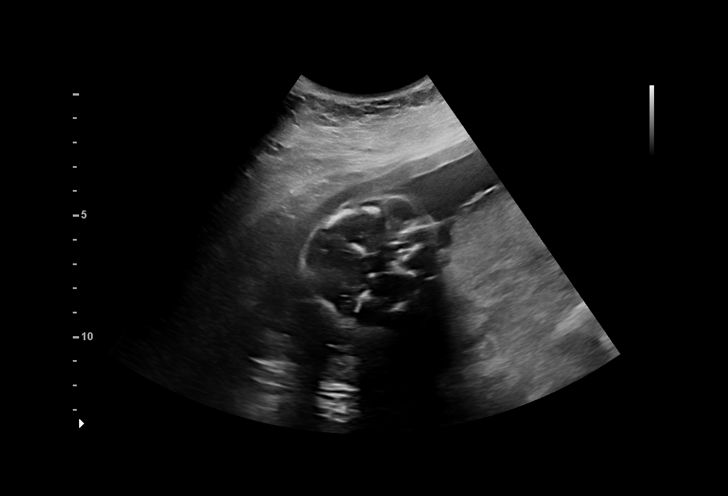
[im 25/97]
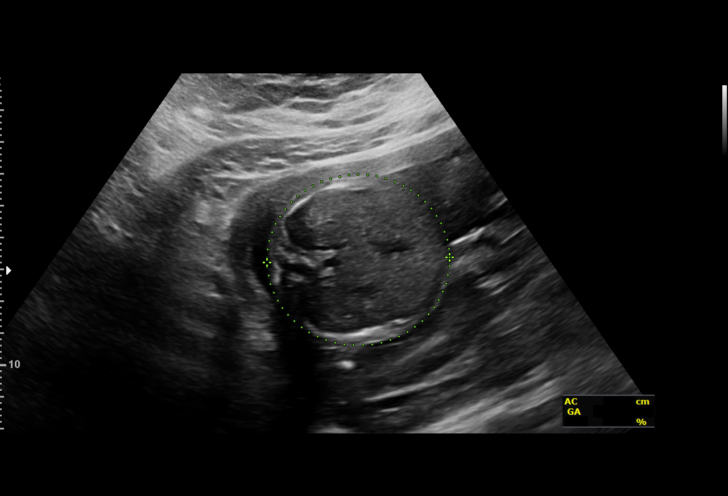
[im 33/97]
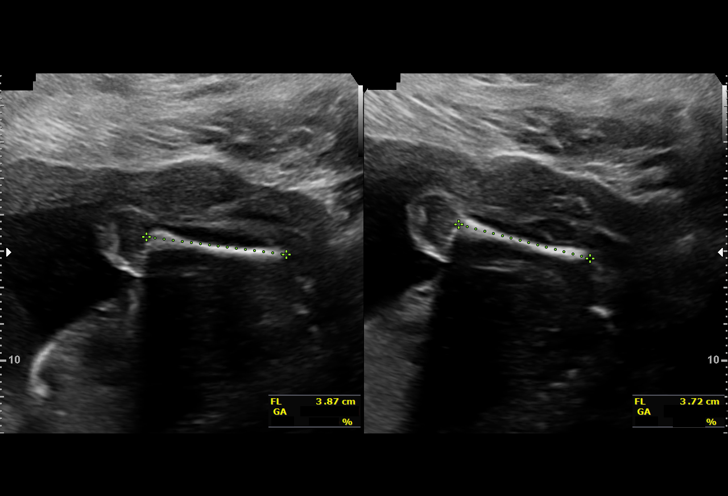
[im 40/97]
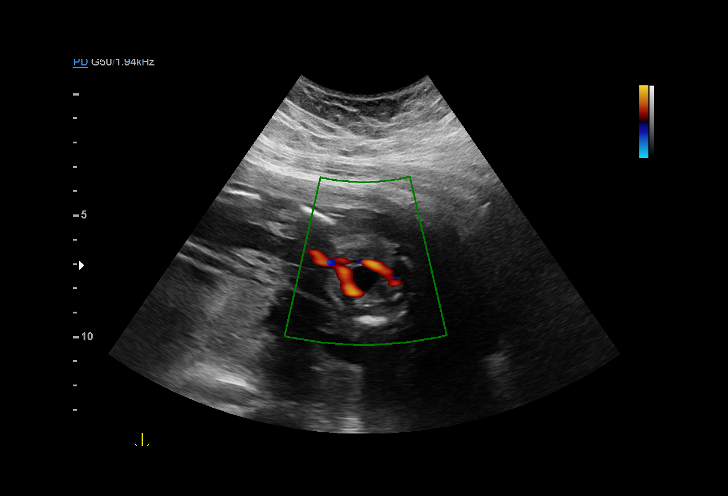
[im 50/97]
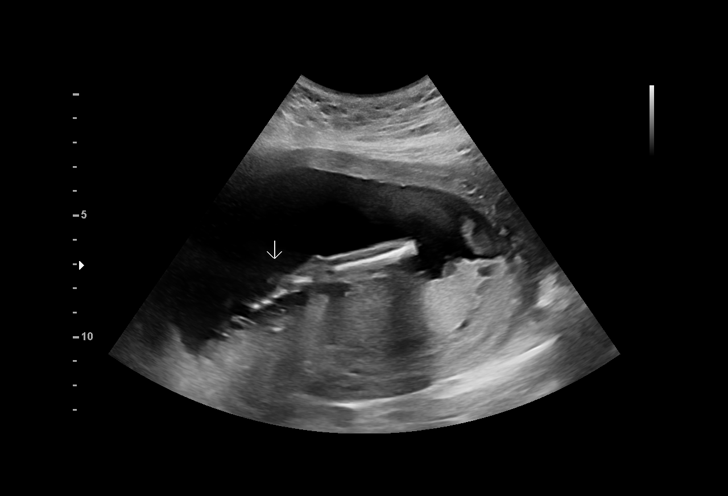
[im 57/97]
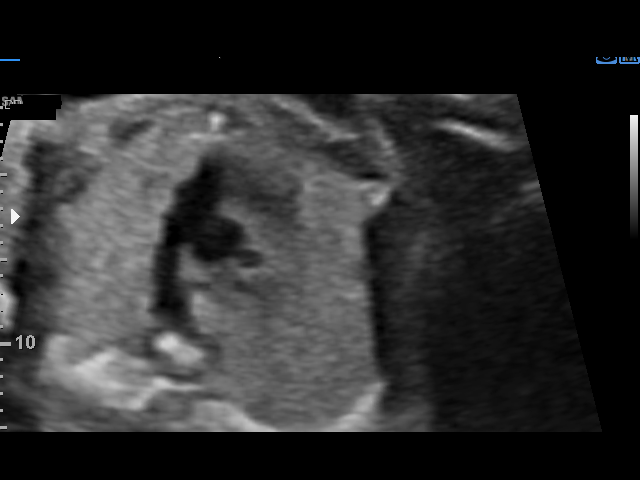
[im 65/97]
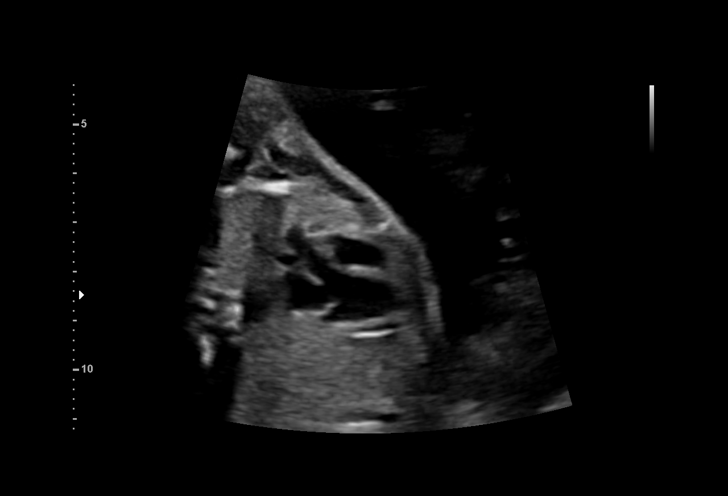
[im 72/97]
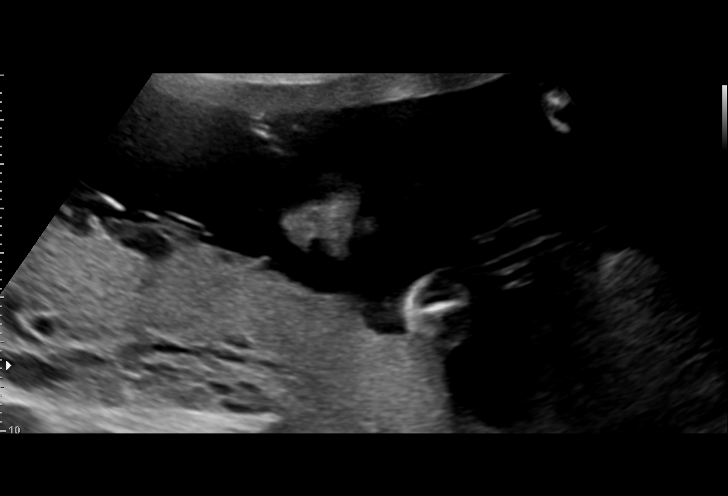
[im 79/97]
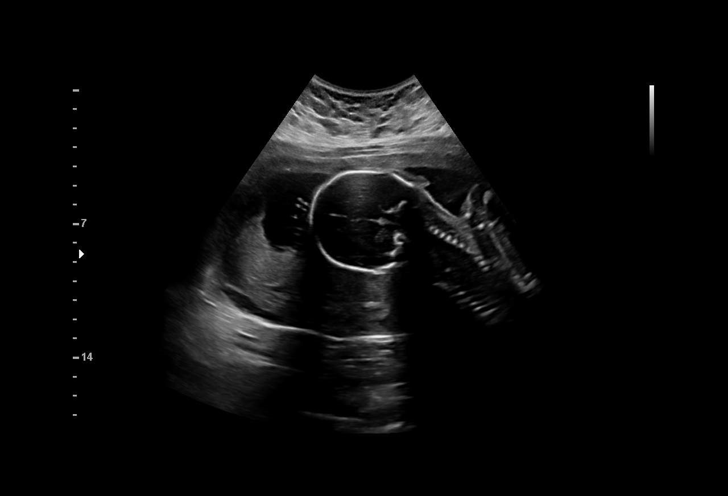
[im 86/97]
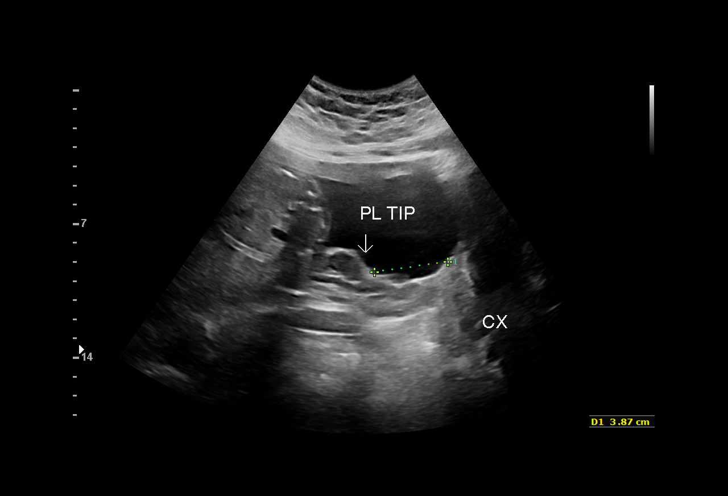
[im 93/97]
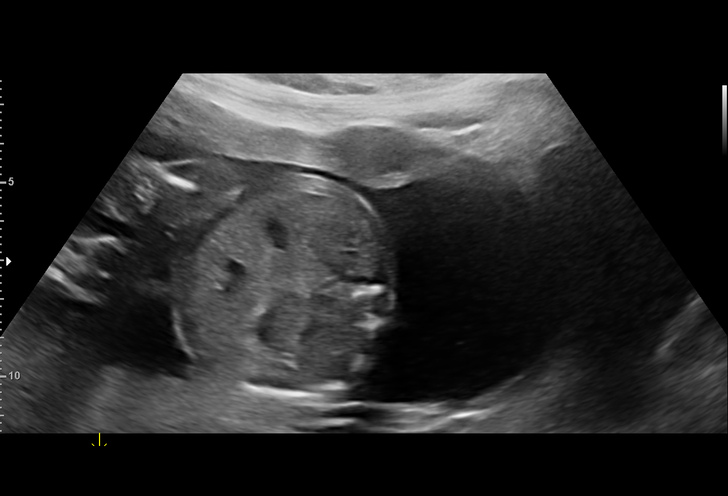

[13 of 28 positions shown; findings below may reference images not displayed]

Obstetrics &
                                                            Gynecology
                                                            [address]
                                                            [place]

Indications

 Hypertension - Chronic/Pre-existing
 Pre-existing diabetes, type 2, in pregnancy,
 second trimester (oral)
 Advanced maternal age multigravida 35+,
 second trimester
 Maternal morbid obesity
 22 weeks gestation of pregnancy
 Encounter for antenatal screening for
 malformations
 LR NIPS, Neg AFP, Neg Horizon
 Uterine fibroids
Fetal Evaluation

 Num Of Fetuses:         1
 Fetal Heart Rate(bpm):  151
 Cardiac Activity:       Observed
 Presentation:           Breech
 Placenta:               Posterior
 P. Cord Insertion:      Not well visualized
 Amniotic Fluid
 AFI FV:      Within normal limits

                             Largest Pocket(cm)

Biometry

 BPD:      51.2  mm     G. Age:  21w 4d          7  %    CI:        68.81   %    70 - 86
                                                         FL/HC:      19.3   %    19.2 -
 HC:      197.2  mm     G. Age:  21w 6d          8  %    HC/AC:      1.11        1.05 -
 AC:      178.1  mm     G. Age:  22w 5d         36  %    FL/BPD:     74.2   %    71 - 87
 FL:         38  mm     G. Age:  22w 1d         18  %    FL/AC:      21.3   %    20 - 24
 HUM:      36.7  mm     G. Age:  22w 6d         42  %
 CER:      23.7  mm     G. Age:  21w 6d         46  %
 NFT:       4.0  mm

 LV:        4.8  mm
 CM:        3.3  mm

 Est. FW:     497  gm      1 lb 2 oz     21  %
OB History

 Gravidity:    3         Term:   1         SAB:   1
 Living:       1
Gestational Age

 LMP:           22w 6d        Date:  02/01/20                 EDD:   11/07/20
 U/S Today:     22w 1d                                        EDD:   11/12/20
 Best:          22w 6d     Det. By:  LMP  (02/01/20)          EDD:   11/07/20
Anatomy

 Cranium:               Appears normal         LVOT:                   Appears normal
 Cavum:                 Appears normal         Aortic Arch:            Appears normal
 Ventricles:            Appears normal         Ductal Arch:            Appears normal
 Choroid Plexus:        Appears normal         Diaphragm:              Appears normal
 Cerebellum:            Appears normal         Stomach:                Appears normal, left
                                                                       sided
 Posterior Fossa:       Appears normal         Abdomen:                Appears normal
 Nuchal Fold:           Not applicable (>20    Abdominal Wall:         Not well visualized
                        wks GA)
 Face:                  Appears normal         Cord Vessels:           Appears normal (3
                        (orbits and profile)                           vessel cord)
 Lips:                  Appears normal         Kidneys:                Appear normal
 Palate:                Not well visualized    Bladder:                Appears normal
 Thoracic:              Appears normal         Spine:                  Ltd views no
                                                                       intracranial signs of
                                                                       NTD
 Heart:                 Appears normal         Upper Extremities:      Appears normal
                        (4CH, axis, and
                        situs)
 RVOT:                  Appears normal         Lower Extremities:      Appears normal

 Other:  Heels visualized. Left Open hand visualized. Nasal bone visualized. S
         spine nwv.  Technically difficult due to maternal habitus and fetal
         position.
Cervix Uterus Adnexa

 Cervix
 Length:            4.3  cm.
 Normal appearance by transabdominal scan.

 Uterus
 Single fibroid noted, see table below.
Myomas

 Site                     L(cm)      W(cm)      D(cm)       Location
 Anterior                 2.4        2

 Blood Flow                  RI       PI       Comments

Impression

 Single intrauterine pregnancy here for a detailed anatomy
 due to type 2 diabetes, chronic hypertension with history of
 IUFD at 17 weeks
 Normal anatomy with measurements consistent with dates
 There is good fetal movement and amniotic fluid volume
 Suboptimal views of the fetal anatomy were obtained
 secondary to fetal position.

 I reviewed today's study and recommend serial growth, fetal
 echocardiogram and initiation of weekly testing at 32 weeks.
 We discussed the increased risk for fetal macrosomia,
 cardiac defects, maternal and fetal birth trauma with
 temporary and/or permenant damage, stillbirth and neonatal
 ICU admission.

 Ms. Ell conveyed that she is taking Metformin daily with a
 FBS this morning of 99 mg/dL. We reviewed the goals of a
 FBS 60-90 mg/dL and 1hr < 140 mg/DL or 2hr < 120 mg/dL.

 In addition we discussed starting daily low dose ASA for the
 prevention of preeclampsia.

 She believes that a fetal echocardiogram has been ordered
 by your offices but has not recieved
Recommendations

 Follow up growth in 4 weeks and repeat at [DATE], and 36
 weeks.
 Initiate weekly testing at 32 weeks
 Delivery between 37-39 weeks
 Fetal echocardiogram between 22-26 weeks
 Initiate daily ASA

## 2022-11-01 IMAGING — US US MFM FETAL BPP W/O NON-STRESS
1 series · 14 of 28 positions shown · non-contrast
Comparison: none

[Series 1: us mfm fetal bpp w/o non-stress · 46 acquisitions, 14 frames shown]
[im 2/46]
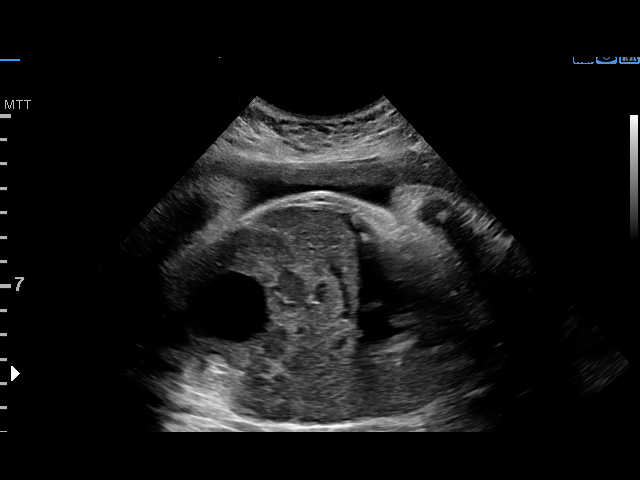
[im 6/46]
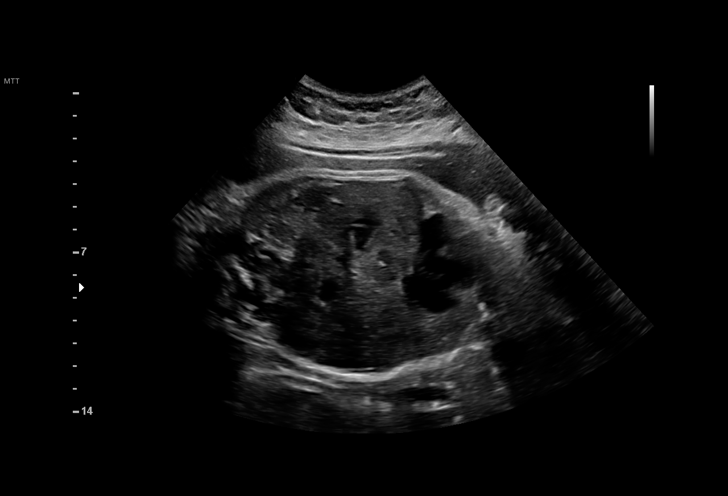
[im 9/46]
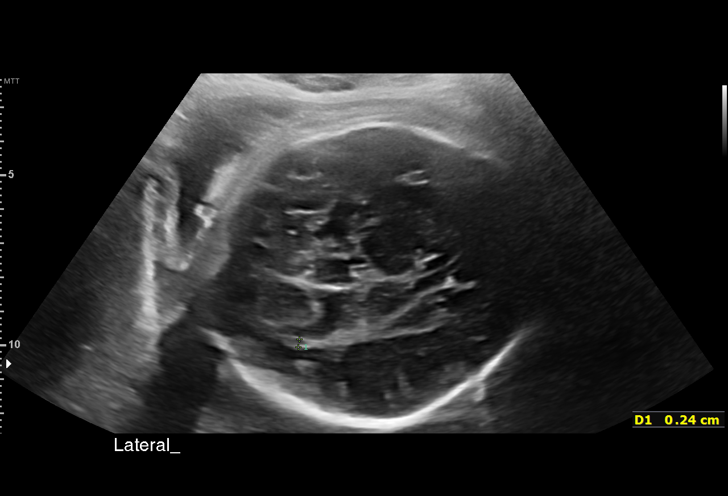
[im 12/46]
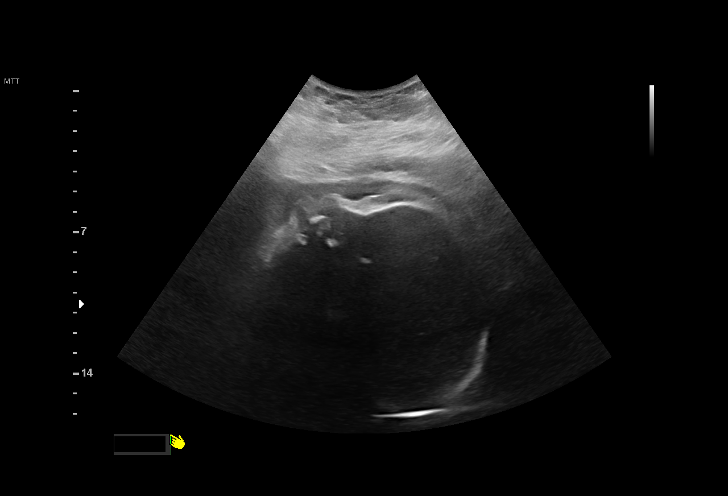
[im 16/46]
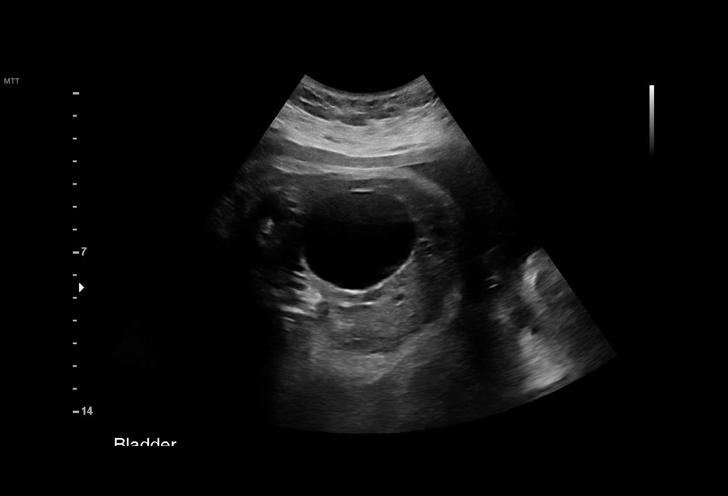
[im 19/46]
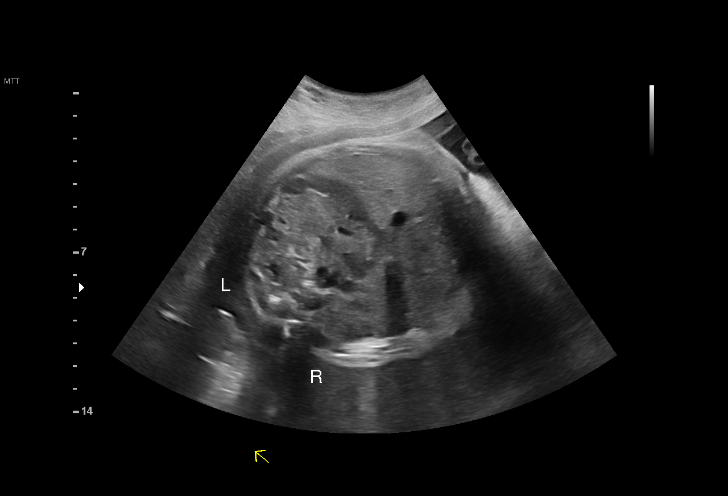
[im 22/46]
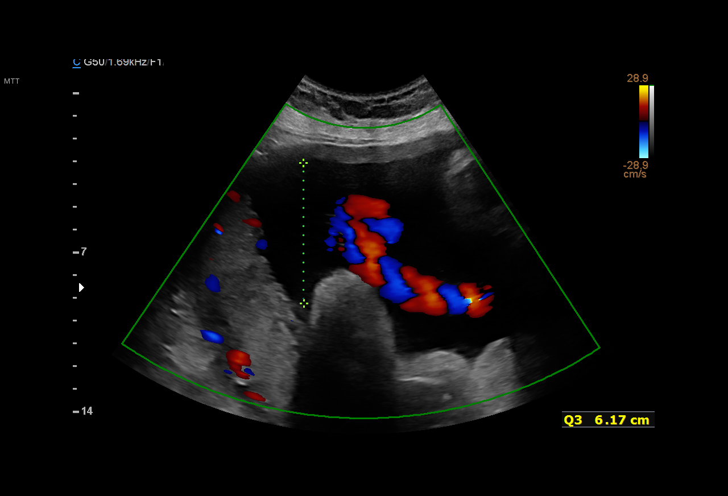
[im 26/46]
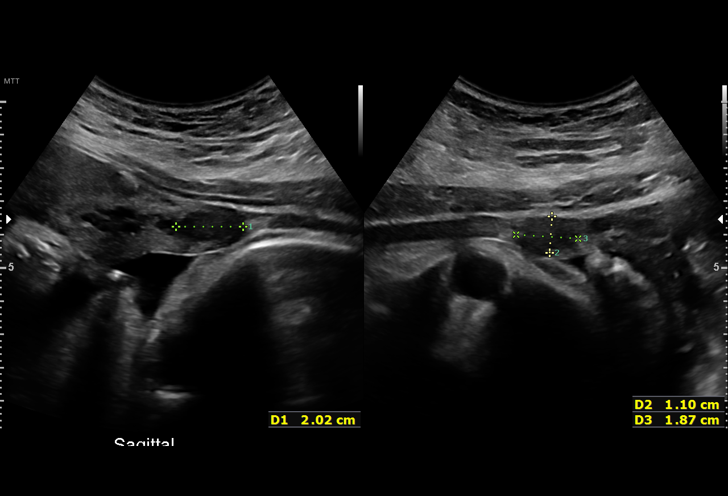
[im 29/46]
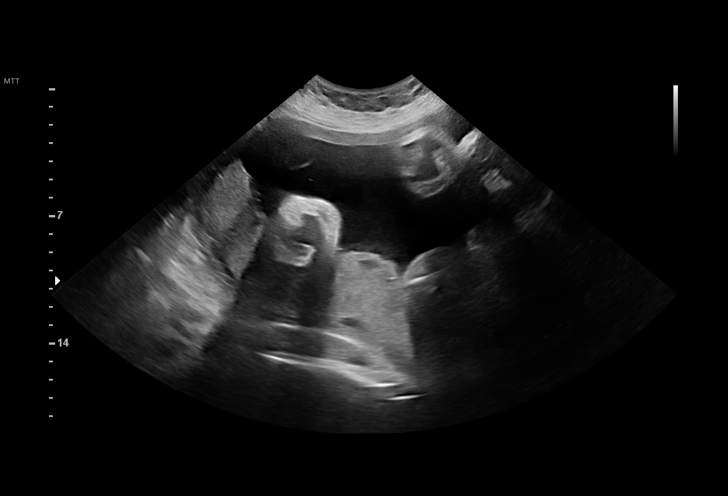
[im 32/46]
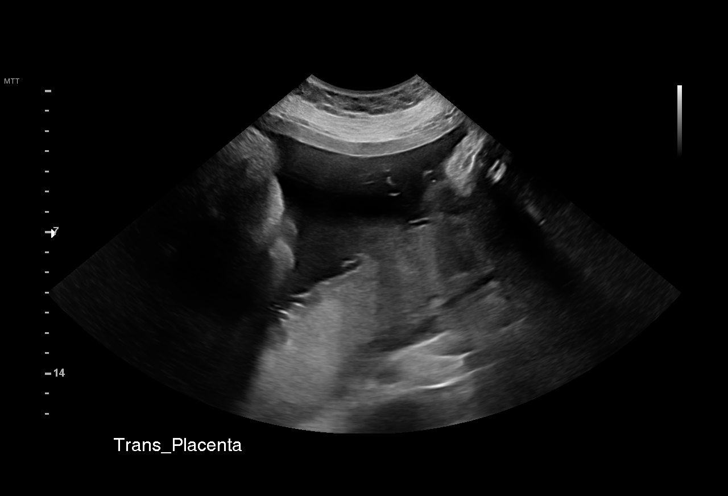
[im 36/46]
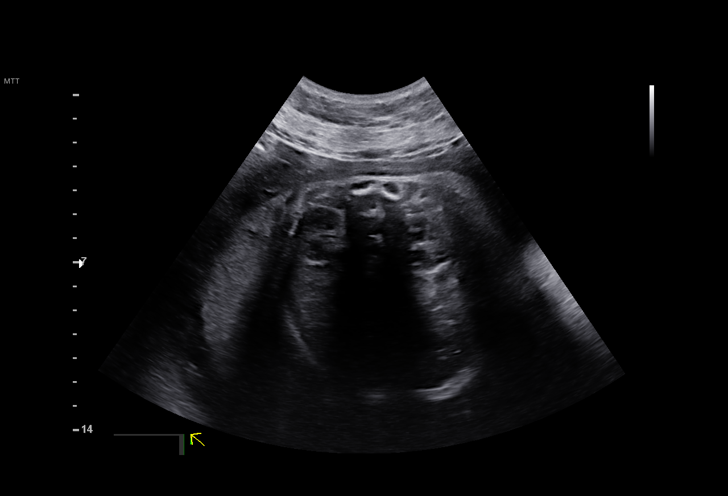
[im 39/46]
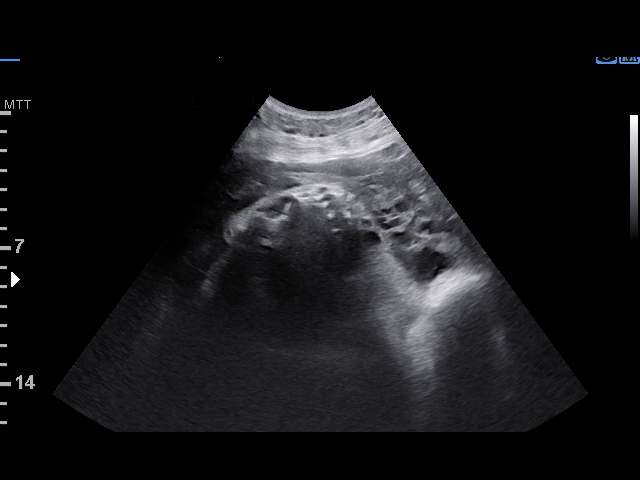
[im 42/46]
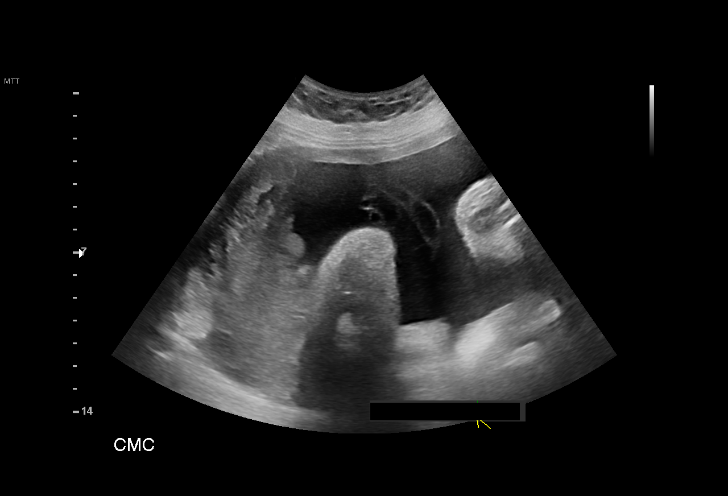
[im 46/46]
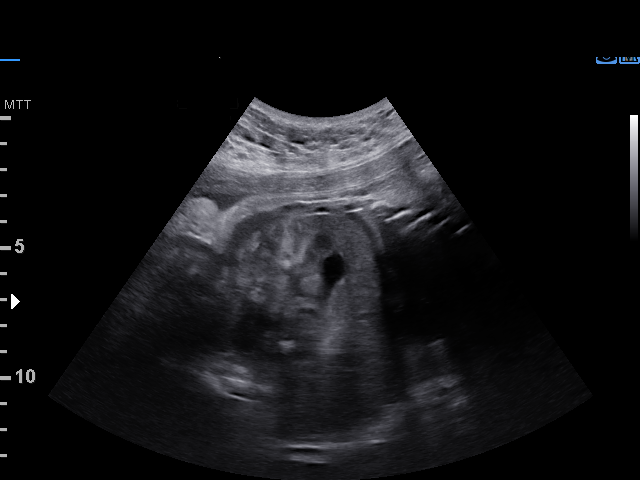

[14 of 28 positions shown; findings below may reference images not displayed]

Obstetrics &
                                                            Gynecology
                                                            [address]
                                                            [place]

Indications

 Pre-existing diabetes, type 2, in pregnancy,
 third trimester (metformin)
 Hypertension - Chronic/Pre-existing
 (nifedipine)
 34 weeks gestation of pregnancy
 Maternal morbid obesity
 LR NIPS, Neg AFP, Neg Horizon
 Uterine fibroids
Fetal Evaluation

 Num Of Fetuses:         1
 Fetal Heart Rate(bpm):  140
 Cardiac Activity:       Observed
 Presentation:           Cephalic
 Placenta:               Posterior
 P. Cord Insertion:      Previously Visualized

 Amniotic Fluid
 AFI FV:      Within normal limits

 AFI Sum(cm)     %Tile       Largest Pocket(cm)
 14.9            53
 RUQ(cm)       RLQ(cm)       LUQ(cm)        LLQ(cm)

Biophysical Evaluation

 Amniotic F.V:   Pocket => 2 cm             F. Tone:        Observed
 F. Movement:    Observed                   Score:          [DATE]
 F. Breathing:   Observed
OB History

 Gravidity:    3         Term:   1         SAB:   1
 Living:       1
Gestational Age

 LMP:           34w 2d        Date:  02/01/20                 EDD:   11/07/20
 Best:          34w 2d     Det. By:  LMP  (02/01/20)          EDD:   11/07/20
Anatomy

 Cranium:               Previously seen        LVOT:                   Previously seen
 Cavum:                 Previously seen        Aortic Arch:            Previously seen
 Ventricles:            Appears normal         Ductal Arch:            Previously seen
 Choroid Plexus:        Previously seen        Diaphragm:              Appears normal
 Cerebellum:            Previously seen        Stomach:                Appears normal, left
                                                                       sided
 Posterior Fossa:       Previously seen        Abdomen:                Previously seen
 Nuchal Fold:           Not applicable (>20    Abdominal Wall:         Previously seen
                        wks GA)
 Face:                  Orbits and profile     Cord Vessels:           Previously seen
                        previously seen
 Lips:                  Previously seen        Kidneys:                Appear normal
 Palate:                Not well visualized    Bladder:                Appears normal
 Thoracic:              Previously seen        Spine:                  Limited views
                                                                       appear normal.
 Heart:                 Appears normal         Upper Extremities:      Previously seen
                        (4CH, axis, and
                        situs)
 RVOT:                  Previously seen        Lower Extremities:      Previously seen

 Other:  Heels prev visualized. Left Open hand prev visualized. Nasal bone
         prev visualized. Spine seen in complete by all ultrasounds.
         Technically difficult due to maternal habitus and fetal position.
Cervix Uterus Adnexa

 Cervix
 Not visualized (advanced GA >45wks)

 Uterus
 Single fibroid noted, see table below.

 Right Ovary
 Within normal limits.
 Left Ovary
 Within normal limits.

 Cul De Sac
 No free fluid seen.

 Adnexa
 No abnormality visualized.
Myomas

 Site                     L(cm)      W(cm)      D(cm)       Location
 Anterior                 2          1.1        1.9         Intramural

 Blood Flow                  RI       PI       Comments

Comments

 This patient was seen for a biophysical profile due to
 pregestational diabetes treated with Metformin and chronic
 hypertension treated with nifedipine.  She denies any
 problems since her last exam.
 A biophysical profile performed today was [DATE].
 There was normal amniotic fluid noted on today's ultrasound
 exam.
 Another biophysical profile was scheduled in 1 week.

 Due to her underlying medical conditions, delivery may be
 considered at 37 weeks or greater.
